# Patient Record
Sex: Female | Born: 1985 | Race: White | Hispanic: No | Smoking: Former smoker
Health system: Southern US, Community
[De-identification: ages and names within clinical notes are randomized; demographics above are authoritative.]

## PROBLEM LIST (undated history)

## (undated) DIAGNOSIS — F909 Attention-deficit hyperactivity disorder, unspecified type: Secondary | ICD-10-CM

## (undated) DIAGNOSIS — B009 Herpesviral infection, unspecified: Secondary | ICD-10-CM

## (undated) DIAGNOSIS — N189 Chronic kidney disease, unspecified: Secondary | ICD-10-CM

## (undated) HISTORY — DX: Chronic kidney disease, unspecified: N18.9

## (undated) HISTORY — PX: KIDNEY SURGERY: SHX687

## (undated) HISTORY — DX: Herpesviral infection, unspecified: B00.9

## (undated) HISTORY — DX: Attention-deficit hyperactivity disorder, unspecified type: F90.9

---

## 2008-06-30 ENCOUNTER — Emergency Department: Payer: Self-pay | Admitting: Emergency Medicine

## 2009-03-18 ENCOUNTER — Inpatient Hospital Stay (HOSPITAL_COMMUNITY): Admission: AD | Admit: 2009-03-18 | Discharge: 2009-03-19 | Payer: Self-pay | Admitting: Obstetrics & Gynecology

## 2010-04-21 ENCOUNTER — Ambulatory Visit: Payer: Self-pay | Admitting: Obstetrics and Gynecology

## 2010-04-21 ENCOUNTER — Inpatient Hospital Stay (HOSPITAL_COMMUNITY): Admission: AD | Admit: 2010-04-21 | Discharge: 2010-04-21 | Payer: Self-pay | Admitting: Obstetrics & Gynecology

## 2010-04-28 ENCOUNTER — Inpatient Hospital Stay (HOSPITAL_COMMUNITY): Admission: RE | Admit: 2010-04-28 | Discharge: 2010-04-28 | Payer: Self-pay | Admitting: Obstetrics & Gynecology

## 2010-04-28 ENCOUNTER — Ambulatory Visit: Payer: Self-pay | Admitting: Obstetrics and Gynecology

## 2010-05-06 ENCOUNTER — Ambulatory Visit: Payer: Self-pay | Admitting: Family Medicine

## 2010-05-06 ENCOUNTER — Encounter: Payer: Self-pay | Admitting: Obstetrics & Gynecology

## 2010-05-06 LAB — CONVERTED CEMR LAB
Antibody Screen: NEGATIVE
Basophils Relative: 0 % (ref 0–1)
Hemoglobin: 13.3 g/dL (ref 12.0–15.0)
MCHC: 33.2 g/dL (ref 30.0–36.0)
Monocytes Absolute: 0.5 10*3/uL (ref 0.1–1.0)
Monocytes Relative: 7 % (ref 3–12)
Neutro Abs: 5.2 10*3/uL (ref 1.7–7.7)
RBC: 4.39 M/uL (ref 3.87–5.11)
Rh Type: POSITIVE
Rubella: 8.6 intl units/mL — ABNORMAL HIGH

## 2010-05-11 ENCOUNTER — Ambulatory Visit: Payer: Self-pay | Admitting: Obstetrics and Gynecology

## 2010-05-11 ENCOUNTER — Other Ambulatory Visit: Admission: RE | Admit: 2010-05-11 | Discharge: 2010-05-11 | Payer: Self-pay | Admitting: Obstetrics and Gynecology

## 2010-06-02 ENCOUNTER — Ambulatory Visit (HOSPITAL_COMMUNITY): Admission: RE | Admit: 2010-06-02 | Discharge: 2010-06-02 | Payer: Self-pay | Admitting: Obstetrics and Gynecology

## 2010-06-09 ENCOUNTER — Ambulatory Visit: Payer: Self-pay | Admitting: Family Medicine

## 2010-06-30 ENCOUNTER — Ambulatory Visit (HOSPITAL_COMMUNITY): Admission: RE | Admit: 2010-06-30 | Discharge: 2010-06-30 | Payer: Self-pay | Admitting: Obstetrics and Gynecology

## 2010-07-07 ENCOUNTER — Ambulatory Visit: Payer: Self-pay | Admitting: Family Medicine

## 2010-07-14 ENCOUNTER — Ambulatory Visit (HOSPITAL_COMMUNITY): Admission: RE | Admit: 2010-07-14 | Discharge: 2010-07-14 | Payer: Self-pay | Admitting: Family Medicine

## 2010-07-14 ENCOUNTER — Encounter: Payer: Self-pay | Admitting: Family Medicine

## 2010-08-04 ENCOUNTER — Ambulatory Visit: Payer: Self-pay | Admitting: Family Medicine

## 2010-09-01 ENCOUNTER — Ambulatory Visit: Payer: Self-pay | Admitting: Family Medicine

## 2010-09-15 ENCOUNTER — Ambulatory Visit: Payer: Self-pay | Admitting: Family Medicine

## 2010-09-15 ENCOUNTER — Encounter (INDEPENDENT_AMBULATORY_CARE_PROVIDER_SITE_OTHER): Payer: Self-pay | Admitting: *Deleted

## 2010-09-15 LAB — CONVERTED CEMR LAB
HIV: NONREACTIVE
Hemoglobin: 11.9 g/dL — ABNORMAL LOW (ref 12.0–15.0)
Platelets: 285 10*3/uL (ref 150–400)
RDW: 12.6 % (ref 11.5–15.5)

## 2010-09-18 ENCOUNTER — Inpatient Hospital Stay (HOSPITAL_COMMUNITY)
Admission: AD | Admit: 2010-09-18 | Discharge: 2010-09-18 | Payer: Self-pay | Source: Home / Self Care | Attending: Family Medicine | Admitting: Family Medicine

## 2010-09-22 ENCOUNTER — Ambulatory Visit (HOSPITAL_COMMUNITY)
Admission: RE | Admit: 2010-09-22 | Discharge: 2010-09-22 | Payer: Self-pay | Source: Home / Self Care | Attending: Obstetrics & Gynecology | Admitting: Obstetrics & Gynecology

## 2010-10-06 ENCOUNTER — Ambulatory Visit
Admission: RE | Admit: 2010-10-06 | Discharge: 2010-10-06 | Payer: Self-pay | Source: Home / Self Care | Attending: Family Medicine | Admitting: Family Medicine

## 2010-10-07 ENCOUNTER — Encounter: Payer: Self-pay | Admitting: Family Medicine

## 2010-10-07 LAB — CONVERTED CEMR LAB: Yeast Wet Prep HPF POC: NONE SEEN

## 2010-10-20 ENCOUNTER — Ambulatory Visit
Admission: RE | Admit: 2010-10-20 | Discharge: 2010-10-20 | Payer: Self-pay | Source: Home / Self Care | Attending: Obstetrics & Gynecology | Admitting: Obstetrics & Gynecology

## 2010-11-03 ENCOUNTER — Encounter: Payer: Medicaid Other | Admitting: Obstetrics and Gynecology

## 2010-11-03 DIAGNOSIS — Z3400 Encounter for supervision of normal first pregnancy, unspecified trimester: Secondary | ICD-10-CM

## 2010-11-17 ENCOUNTER — Encounter: Payer: Self-pay | Admitting: Obstetrics & Gynecology

## 2010-11-17 ENCOUNTER — Encounter: Payer: Medicaid Other | Admitting: Obstetrics and Gynecology

## 2010-11-17 DIAGNOSIS — Z3400 Encounter for supervision of normal first pregnancy, unspecified trimester: Secondary | ICD-10-CM

## 2010-11-24 ENCOUNTER — Encounter: Payer: Medicaid Other | Admitting: Obstetrics and Gynecology

## 2010-11-24 DIAGNOSIS — Z3400 Encounter for supervision of normal first pregnancy, unspecified trimester: Secondary | ICD-10-CM

## 2010-11-30 LAB — URINALYSIS, ROUTINE W REFLEX MICROSCOPIC
Bilirubin Urine: NEGATIVE
Glucose, UA: NEGATIVE mg/dL
Hgb urine dipstick: NEGATIVE
Ketones, ur: NEGATIVE mg/dL
Nitrite: NEGATIVE
Protein, ur: NEGATIVE mg/dL
Specific Gravity, Urine: >1.03 — ABNORMAL HIGH (ref 1.005–1.030)
Urobilinogen, UA: 0.2 mg/dL (ref 0.0–1.0)
pH: 5.5 (ref 5.0–8.0)

## 2010-11-30 LAB — WET PREP, GENITAL: Trich, Wet Prep: NONE SEEN

## 2010-11-30 LAB — GC/CHLAMYDIA PROBE AMP, GENITAL
Chlamydia, DNA Probe: NEGATIVE
GC Probe Amp, Genital: NEGATIVE

## 2010-12-01 ENCOUNTER — Encounter: Payer: Medicaid Other | Admitting: Obstetrics and Gynecology

## 2010-12-01 DIAGNOSIS — Z3400 Encounter for supervision of normal first pregnancy, unspecified trimester: Secondary | ICD-10-CM

## 2010-12-04 LAB — GC/CHLAMYDIA PROBE AMP, GENITAL: Chlamydia, DNA Probe: NEGATIVE

## 2010-12-04 LAB — CBC
Hemoglobin: 14 g/dL (ref 12.0–15.0)
MCH: 31.8 pg (ref 26.0–34.0)
MCHC: 34.3 g/dL (ref 30.0–36.0)
MCV: 92.5 fL (ref 78.0–100.0)
Platelets: 256 10*3/uL (ref 150–400)

## 2010-12-04 LAB — POCT PREGNANCY, URINE: Preg Test, Ur: POSITIVE

## 2010-12-04 LAB — URINALYSIS, ROUTINE W REFLEX MICROSCOPIC
Bilirubin Urine: NEGATIVE
Glucose, UA: NEGATIVE mg/dL
Specific Gravity, Urine: >1.03 — ABNORMAL HIGH (ref 1.005–1.030)
Urobilinogen, UA: 0.2 mg/dL (ref 0.0–1.0)

## 2010-12-04 LAB — URINE MICROSCOPIC-ADD ON

## 2010-12-04 LAB — WET PREP, GENITAL: Clue Cells Wet Prep HPF POC: NONE SEEN

## 2010-12-04 LAB — HCG, QUANTITATIVE, PREGNANCY: hCG, Beta Chain, Quant, S: 21193 m[IU]/mL — ABNORMAL HIGH (ref <5)

## 2010-12-09 ENCOUNTER — Encounter: Payer: Medicaid Other | Admitting: Obstetrics & Gynecology

## 2010-12-09 DIAGNOSIS — Z3400 Encounter for supervision of normal first pregnancy, unspecified trimester: Secondary | ICD-10-CM

## 2010-12-16 ENCOUNTER — Encounter: Payer: Medicaid Other | Admitting: Obstetrics & Gynecology

## 2010-12-16 ENCOUNTER — Inpatient Hospital Stay (HOSPITAL_COMMUNITY)
Admission: AD | Admit: 2010-12-16 | Discharge: 2010-12-19 | DRG: 775 | Disposition: A | Discharge status: Home / Self Care | Payer: Medicaid Other | Source: Ambulatory Visit | Attending: Obstetrics and Gynecology | Admitting: Obstetrics and Gynecology

## 2010-12-16 DIAGNOSIS — O480 Post-term pregnancy: Principal | ICD-10-CM | POA: Diagnosis present

## 2010-12-16 DIAGNOSIS — Z3400 Encounter for supervision of normal first pregnancy, unspecified trimester: Secondary | ICD-10-CM

## 2010-12-16 LAB — CBC
HCT: 33.9 % — ABNORMAL LOW (ref 36.0–46.0)
Hemoglobin: 10.9 g/dL — ABNORMAL LOW (ref 12.0–15.0)
MCV: 82.5 fL (ref 78.0–100.0)
RBC: 4.11 MIL/uL (ref 3.87–5.11)
RDW: 13.4 % (ref 11.5–15.5)
WBC: 9 10*3/uL (ref 4.0–10.5)

## 2010-12-17 DIAGNOSIS — O480 Post-term pregnancy: Secondary | ICD-10-CM

## 2010-12-21 ENCOUNTER — Ambulatory Visit: Payer: Medicaid Other | Admitting: Obstetrics and Gynecology

## 2010-12-28 ENCOUNTER — Ambulatory Visit: Payer: Medicaid Other | Admitting: Obstetrics & Gynecology

## 2010-12-28 DIAGNOSIS — Z09 Encounter for follow-up examination after completed treatment for conditions other than malignant neoplasm: Secondary | ICD-10-CM

## 2010-12-28 LAB — HERPES SIMPLEX VIRUS CULTURE

## 2010-12-28 LAB — POCT PREGNANCY, URINE: Preg Test, Ur: NEGATIVE

## 2010-12-28 LAB — URINE MICROSCOPIC-ADD ON

## 2010-12-28 LAB — URINALYSIS, ROUTINE W REFLEX MICROSCOPIC
Bilirubin Urine: NEGATIVE
Glucose, UA: NEGATIVE mg/dL
Ketones, ur: NEGATIVE mg/dL
pH: 5.5 (ref 5.0–8.0)

## 2011-02-02 ENCOUNTER — Ambulatory Visit (INDEPENDENT_AMBULATORY_CARE_PROVIDER_SITE_OTHER): Payer: Medicaid Other | Admitting: Obstetrics & Gynecology

## 2011-02-23 ENCOUNTER — Ambulatory Visit (INDEPENDENT_AMBULATORY_CARE_PROVIDER_SITE_OTHER): Payer: Medicaid Other | Admitting: Family Medicine

## 2011-02-23 DIAGNOSIS — Z30430 Encounter for insertion of intrauterine contraceptive device: Secondary | ICD-10-CM

## 2011-02-23 DIAGNOSIS — Z3202 Encounter for pregnancy test, result negative: Secondary | ICD-10-CM

## 2011-02-24 NOTE — Assessment & Plan Note (Signed)
Michelle Riddle, ARANAS               ACCOUNT NO.:  [id number]  MEDICAL RECORD NO.:  [id number]           PATIENT TYPE:  LOCATION:  [facility]           FACILITY:  PHYSICIAN:  Tinnie Gens, MD        DATE OF BIRTH:  [birth year]  DATE OF SERVICE:  02/23/2011                                 CLINIC NOTE  CHIEF COMPLAINT:  IUD insertion.  HISTORY OF PRESENT ILLNESS:  The patient is a 25 year old gravida 1, para 1 who is 6 weeks postpartum from a vaginal delivery.  She was seen by Dr. Macon Large on May 15.  She had intercourse and had LMP of May 8. She was scheduled to return after protected intercourse in 2 weeks for repeat pregnancy test which is negative.  She is scheduled for IUD insertion.  PROCEDURE:  The patient was placed in dorsal lithotomy.  As speculum was placed in the vagina.  Cervix visualized, grasped anteriorly with single- tooth tenaculum, cleaned with Betadine x2, sounded to 8 cm and the Mirena IUD was inserted.  The speculum was passed several times easily, but for some reason Mirena would not go until I have regressed the anterior lip of the cervix with a single tooth and had a more direct angle and finally IUD was inserted without difficulty.  Strings were trimmed to 2.5 cm.  IMPRESSION:  Undesired fertility status post Mirena intrauterine device insertion.  PLAN:  The patient will return in 4-6 weeks for IUD string check.  We discussed string length and issues related to that as well as bleeding related to IUD and possible treatments for that.          ______________________________ Tinnie Gens, MD    TP/MEDQ  D:  02/23/2011  T:  02/24/2011  Job:  [id number]

## 2011-03-16 ENCOUNTER — Ambulatory Visit: Payer: Medicaid Other | Admitting: Family Medicine

## 2011-05-17 ENCOUNTER — Ambulatory Visit (INDEPENDENT_AMBULATORY_CARE_PROVIDER_SITE_OTHER): Payer: Medicaid Other | Admitting: Obstetrics and Gynecology

## 2011-05-17 ENCOUNTER — Encounter: Payer: Self-pay | Admitting: Obstetrics and Gynecology

## 2011-05-17 VITALS — BP 125/75 | HR 69 | Ht 64.0 in | Wt 187.0 lb

## 2011-05-17 DIAGNOSIS — IMO0002 Reserved for concepts with insufficient information to code with codable children: Secondary | ICD-10-CM

## 2011-05-17 NOTE — Progress Notes (Signed)
25 yo G1P1 presenting today for evaluation of dyspareunia. Patient states that the onset of pain occurred approximately 2 months ago. She describes the pain as a burning sensation localized in the inferior aspect of the introitus and generalized burning within her vagina. She says that the pain is worst during intercourse and lingers for the next 2-days. Patient has been using different types of scented soaps. Patient denies the presence of a discharge.   Physical exam:  GENERAL: Well-developed, well-nourished female in no acute distress.  ABDOMEN: Soft, nontender, nondistended. No organomegaly. PELVIC: Normal external female genitalia. Introitus erythematous particularly on the inferior aspect; area tender to touch. Perineal laceration healed completely.  Vagina is pink and rugated.  Thin watery discharge. Normal appearing cervix with IUD strings visualized at os. Uterus is normal in size. No adnexal mass or tenderness.  A/P 24yo G1P1 with dyspareunia - Wet prep collected - patient advised to change body soap to Dove (unscented) - pelvic ultrasound ordered to r/o any other etiology - patient advised to abstain until vaginal irritation subsides with the use of the new soap - patient to rtc in 4-5 weeks for follow-up and annual exam

## 2011-05-17 NOTE — Patient Instructions (Signed)
Dyspareunia, Painful Intercourse Dyspareunia is pain caused during sexual intercourse. It is most common in women, but it also happens in men. It is important to tell your caregiver when your pain started, the location of the pain, the type of pain, and when during intercourse it occurs. This helps diagnose and treat the problem. FEMALE CAUSES  The pain from this condition is usually felt when anything is put into the vagina. Even sitting or wearing pants can cause pain. Any part of the genitals can cause pain during sex. Some causes of pain during intercourse are:  Infections of the skin around the vagina.   Vaginal infections, yeast, bacteria, or virus infection.   Vaginismus is a spasm of the muscles around the vagina. The spasm narrows and tightens the vagina and causes pain. The pain of the spasms can be so severe that intercourse is impossible.   Allergic reaction from sperimicides, condoms, scented tampons, soaps, douches, and vaginal sprays.   Cyst (closed sac) on the Bartholin or skene glands, located at the opening of the vagina.   Scar tissue in the vagina, from an episiotomy (surgically enlarged opening), or tearing after delivering a baby.   Vaginal dryness. This is more common in menopause. The normal secretions of the vagina are decreased. Changes in estrogen levels and increased difficulty becoming aroused can cause painful sex.   Lack of foreplay, to lubricate the vagina, can cause vaginal dryness.   Noncancerous tumors (fibroids) in the uterus.   Endometriosis (uterus lining tissue growing outside the uterus).   Tubal pregnancy (pregnancy that starts in the fallopian tube).   Pregnancy or breastfeeding your baby can cause vaginal dryness.   A tilting or prolapse of the uterus. Prolapse is when weak and stretched muscles around the uterus allow it to fall into the vagina.   Problems with the ovaries, cyst, or scar tissue. This may be worse with certain sexual  positions.   Previous surgeries causing adhesions or scar tissue, in the vagina or pelvis.   Bladder and intestinal problems.   Psychological problems may cause pain.   Negative attitudes about sex, experiencing rape, sexual assault, and misinformation about sex are often related to some types of pain.   Previous pelvic infection, causing scar tissue in the pelvis and on the female organs.   Cyst or tumor on the ovary.   Cancer of the female organs.   Certain medicines may be responsible.   Medical problems, diabetes, arthritis, thyroid disease.   Sometimes a cause cannot be found.  FEMALE CAUSES In men, there are many physical causes of sexual discomfort. Some causes of pain during intercourse are:  Infections of the prostate, bladder, or seminal vesicles can cause pain after ejaculation.   Men suffering from interstitial cystitis (inflamed bladder) may have pain from ejaculation.   Gonorrheal infections may cause pain during ejaculation.   Urethritis (inflamed urethra) or prostatitis (inflamed prostate) can make genital stimulation painful or uncomfortable.   Deformities of the penis, such as Peyronie's disease, may cause pain during intercourse.   A tight foreskin may be a cause of pain.   Cancer of the female organs.   Psychological problems are also a cause of pain.  DIAGNOSIS  Your caregiver will take a history and have you describe where the pain is located (outside the vagina, in the vagina, in the pelvis).   Following this, your caregiver will do a physical exam.   For females, let your caregiver know if the exam is too  painful.   During the final part of the exam, your doctor will feel your uterus and ovaries with one hand on the abdomen and one finger in your vagina. This is a pelvic exam.   Blood tests, Pap test, cultures for infection, ultrasound test, and X-rays may be done. You may need to see a specialist for female problems (gynecologist).   Your  caregiver may do a laparoscopy (looking into the pelvis with a lighted tube, through an incision in the abdomen), CT Scan, or MRI.  TREATMENT Your caregiver can help you determine the best course of treatment. Sometimes more testing is done. Continue with the suggested testing, until your caregiver feels sure about your diagnosis and how to treat it. Continue with a caregiver until you are satisfied that the reason for the pain has been found. Be sure that you know what to do about it. Sometimes it is difficult to find the reason for the pain. The search for the cause can be frustrating.  The treatment depends on the cause of the pain. Treatment may include:  Medicines such as antibiotics, vaginal or skin creams, hormones, or antidepressants.   You may need minor or major surgery.   Psychological counseling or group therapy may be needed.   Kegel exercises and vaginal dilators, to help certain cases of vaginismus (spasms).   Apply lubrication as recommended by your caregiver, if you have dryness.   Seek counseling, if treatment does not help.   Sex therapy for you and your sex partner may be necessary.  It is common for the pain to continue after the reason for the pain has been treated. Some reasons for this include a conditioned response. This means that the person having the pain becomes so familiar with the pain, that the pain continues as a response, even though the cause is removed. HOME CARE INSTRUCTIONS  Follow your caregiver's instructions about taking medicines, doing tests, counseling, and follow-up treatment.   Do not use scented tampons, douches, vaginal sprays, or soaps.   Use water- based lubricants for dryness. Oil lubricants can cause irritation.   Do not use spermicides or condoms that irritate you.   Openly discuss with your partner your sexual experience, your desires, foreplay, and different sexual positions for a more comfortable and enjoyable sexual relationship.     Join group sessions for therapy, if needed.   Practice safe sex at all times.   Discuss the problem with your sex partner.   Empty your bladder before having intercourse.   Try different positions during sexual intercourse.   Take over-the-counter pain medicine recommended by your caregiver, before having sexual intercourse.  SEEK MEDICAL CARE IF:  You develop vaginal bleeding after sexual intercourse.   You develop a lump at the opening of your vagina, even if it is not painful.   You have abnormal vaginal discharge.   You have vaginal dryness.   You have itching or irritation of the vulva and/or vagina.   You develop a rash or reaction to your medicine.  SEEK IMMEDIATE MEDICAL CARE IF:  You develop severe abdominal pain during or shortly after sexual intercourse. (You could have a ruptured ovarian cyst or ruptured tubal pregnancy.)   You develop a temperature of 100F (37.9C) or higher, after having sexual intercourse.   You have painful or bloody urination.   You have painful sexual intercourse, and you never had it before.   You pass out after having sexual intercourse.  Document Released: 09/26/2007 Document Re-Released:  12/01/2009 Exit[employer] Patient Information 2011 [place].

## 2011-05-17 NOTE — Progress Notes (Signed)
Patient has been having vaginal soreness after intercourse.

## 2011-05-18 ENCOUNTER — Other Ambulatory Visit: Payer: Self-pay | Admitting: Obstetrics and Gynecology

## 2011-05-19 LAB — WET PREP, GENITAL: Clue Cells Wet Prep HPF POC: NONE SEEN

## 2011-05-20 ENCOUNTER — Other Ambulatory Visit: Payer: Self-pay | Admitting: Obstetrics and Gynecology

## 2011-05-20 MED ORDER — CLOTRIMAZOLE 1 % VA CREA: TOPICAL_CREAM | VAGINAL | Status: DC

## 2011-06-15 ENCOUNTER — Ambulatory Visit: Payer: Medicaid Other | Admitting: Family Medicine

## 2011-06-15 DIAGNOSIS — Z01419 Encounter for gynecological examination (general) (routine) without abnormal findings: Secondary | ICD-10-CM

## 2012-08-27 IMAGING — US US OB NUCHAL TRANSLUCENCY 1ST GEST
1 series · 18 of 28 positions shown · non-contrast
Comparison: none

OBSTETRICAL ULTRASOUND:
 This ultrasound was performed in The [HOSPITAL], and the [facility] report will be stored to [REDACTED] PACS.  This report is also available in [HOSPITAL]?s accessANYware.

[Series 1: us ob nuchal translucency 1st gest · 18 of 36 slices shown]
[im 1/36]
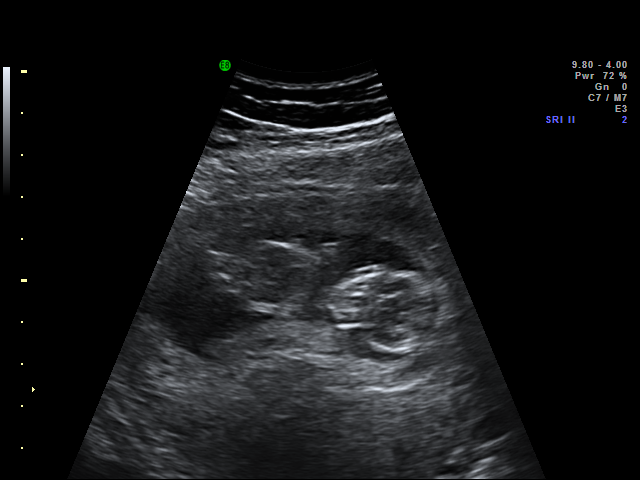
[im 3/36]
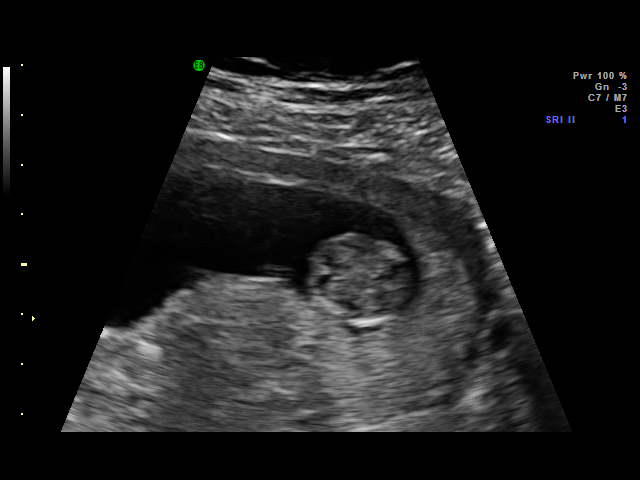
[im 4/36]
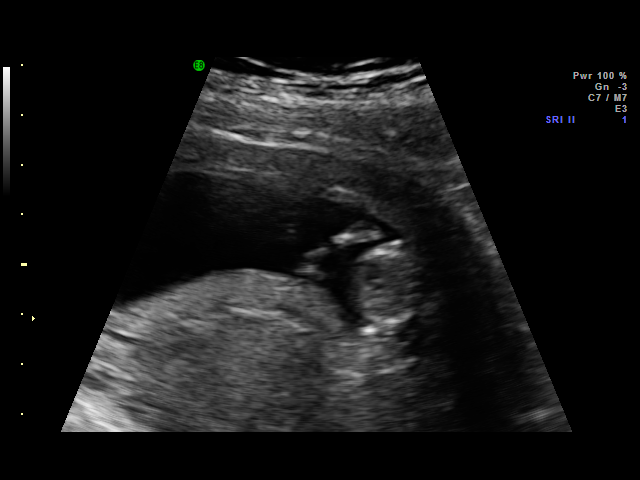
[im 7/36]
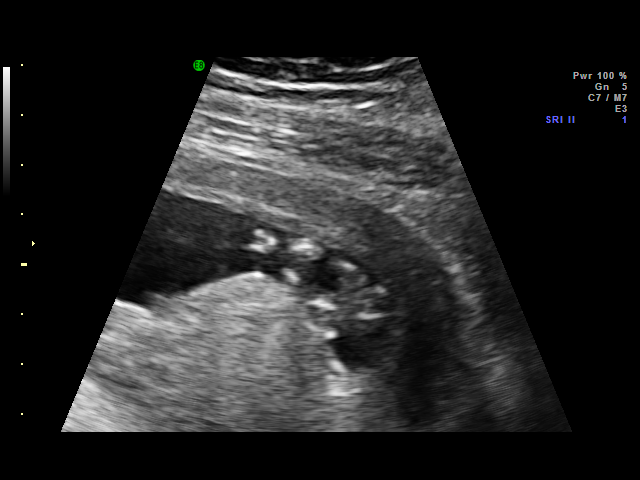
[im 10/36]
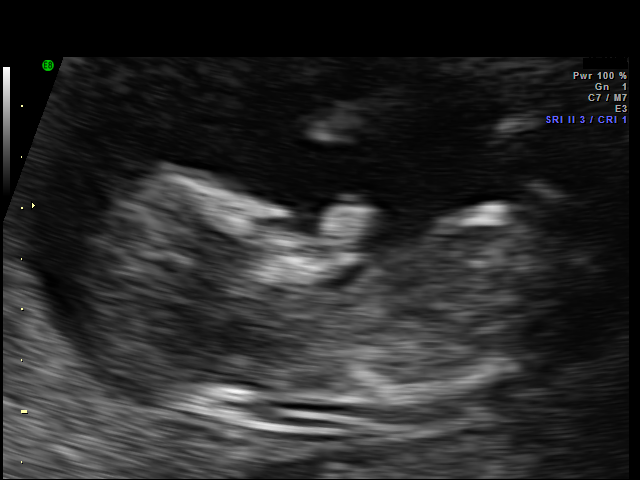
[im 11/36]
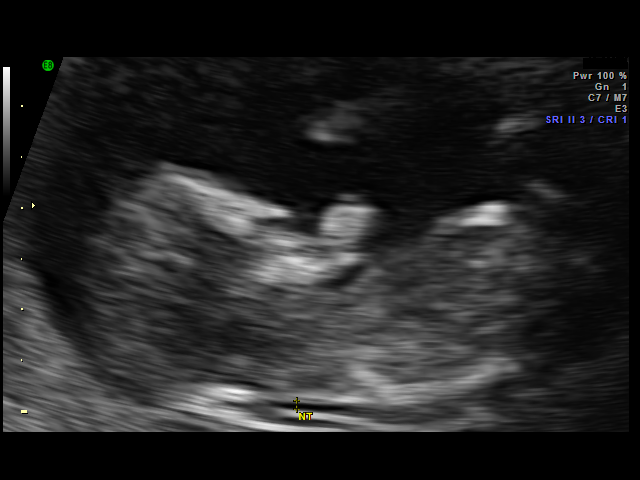
[im 13/36]
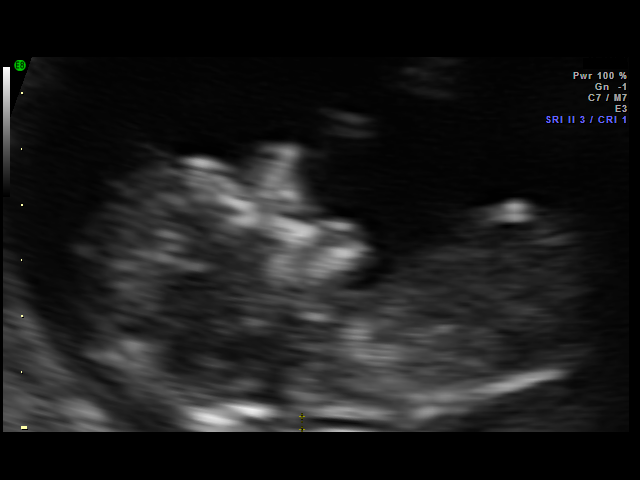
[im 15/36]
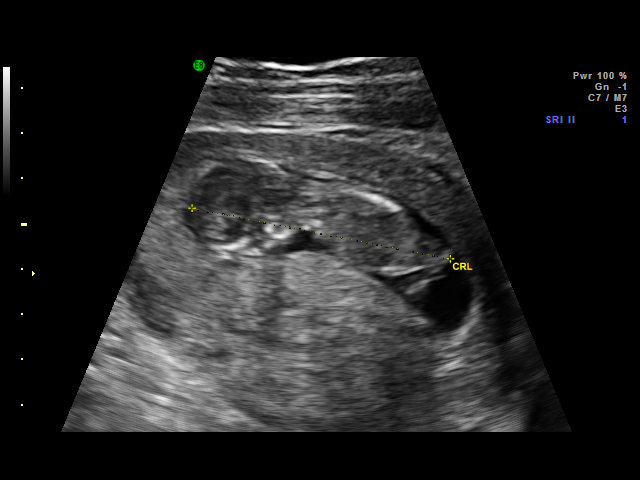
[im 17/36]
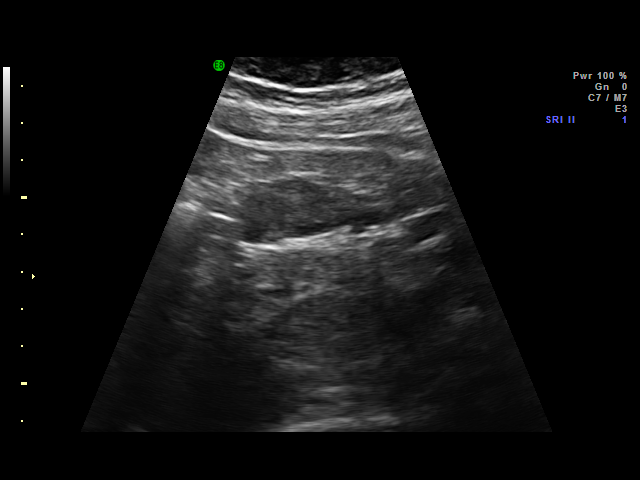
[im 19/36]
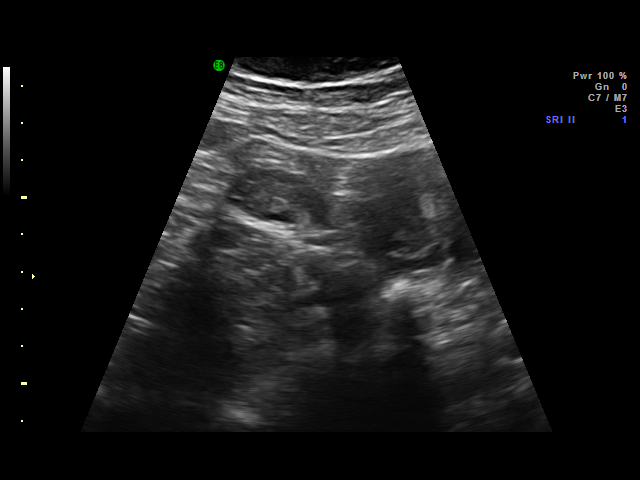
[im 21/36]
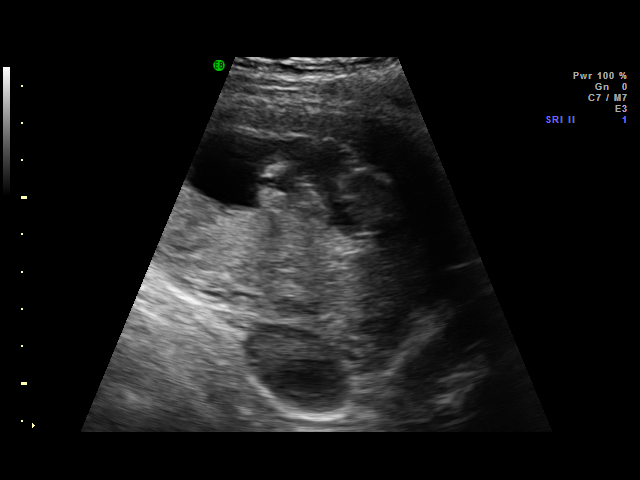
[im 23/36]
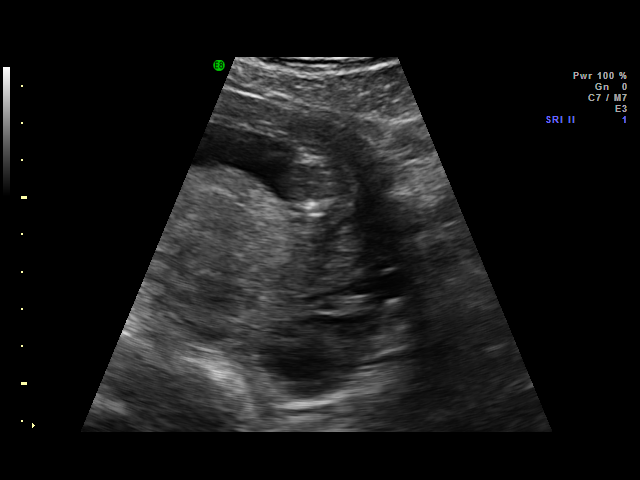
[im 25/36]
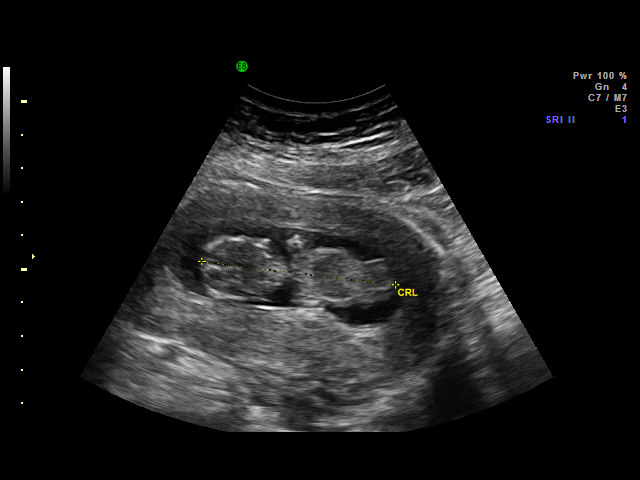
[im 28/36]
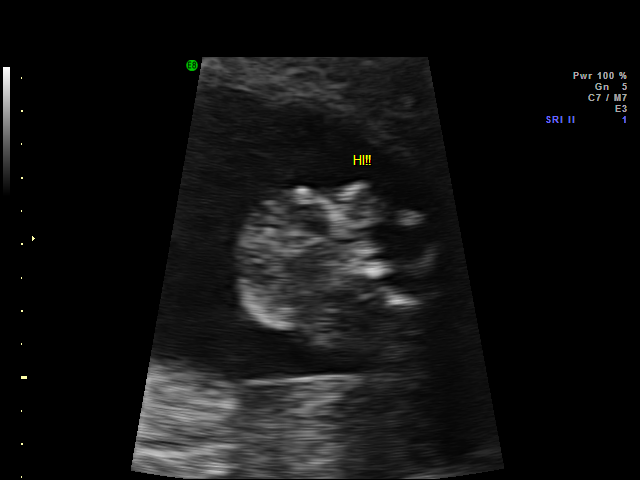
[im 29/36]
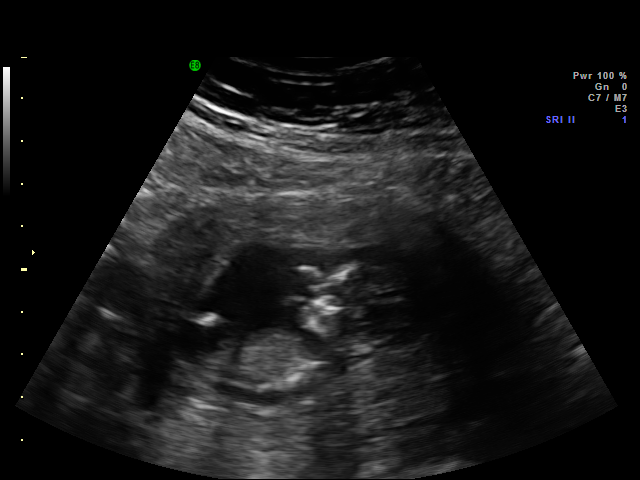
[im 32/36]
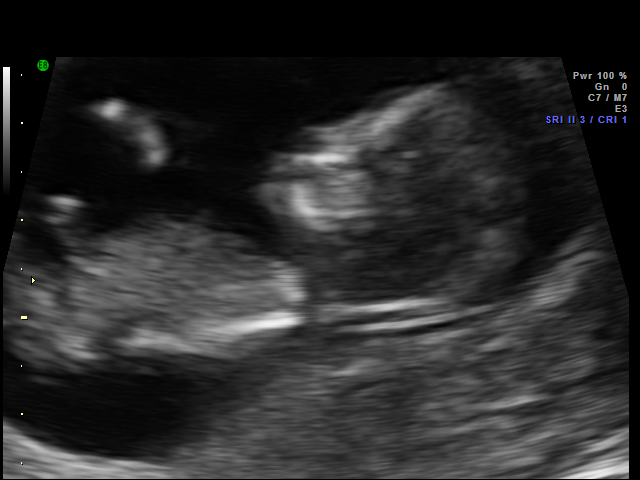
[im 33/36]
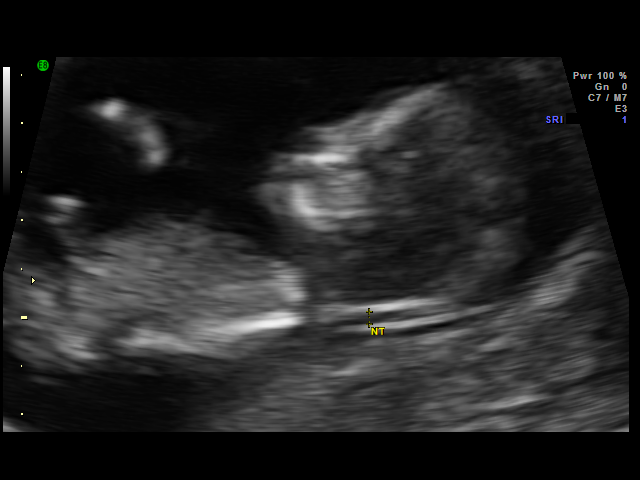
[im 36/36]
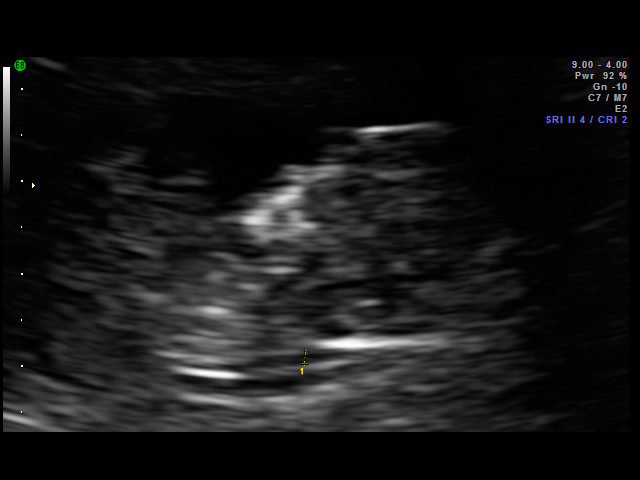

[18 of 28 positions shown; findings below may reference images not displayed]

IMPRESSION: [facility] has also been faxed to the ordering physician.

## 2014-12-27 ENCOUNTER — Emergency Department (HOSPITAL_BASED_OUTPATIENT_CLINIC_OR_DEPARTMENT_OTHER)
Admission: EM | Admit: 2014-12-27 | Discharge: 2014-12-27 | Disposition: A | Discharge status: Home / Self Care | Payer: Medicaid Other | Attending: Emergency Medicine | Admitting: Emergency Medicine

## 2014-12-27 ENCOUNTER — Encounter (HOSPITAL_BASED_OUTPATIENT_CLINIC_OR_DEPARTMENT_OTHER): Payer: Self-pay | Admitting: *Deleted

## 2014-12-27 DIAGNOSIS — Z79899 Other long term (current) drug therapy: Secondary | ICD-10-CM | POA: Insufficient documentation

## 2014-12-27 DIAGNOSIS — Z8619 Personal history of other infectious and parasitic diseases: Secondary | ICD-10-CM | POA: Insufficient documentation

## 2014-12-27 DIAGNOSIS — M545 Low back pain, unspecified: Secondary | ICD-10-CM

## 2014-12-27 DIAGNOSIS — Z720 Tobacco use: Secondary | ICD-10-CM | POA: Insufficient documentation

## 2014-12-27 DIAGNOSIS — Z3202 Encounter for pregnancy test, result negative: Secondary | ICD-10-CM | POA: Insufficient documentation

## 2014-12-27 LAB — URINE MICROSCOPIC-ADD ON

## 2014-12-27 LAB — URINALYSIS, ROUTINE W REFLEX MICROSCOPIC
Bilirubin Urine: NEGATIVE
Glucose, UA: NEGATIVE mg/dL
Hgb urine dipstick: NEGATIVE
Ketones, ur: NEGATIVE mg/dL
NITRITE: NEGATIVE
PH: 6 (ref 5.0–8.0)
Protein, ur: NEGATIVE mg/dL
SPECIFIC GRAVITY, URINE: 1.019 (ref 1.005–1.030)
UROBILINOGEN UA: 0.2 mg/dL (ref 0.0–1.0)

## 2014-12-27 LAB — PREGNANCY, URINE: PREG TEST UR: NEGATIVE

## 2014-12-27 MED ORDER — KETOROLAC TROMETHAMINE 60 MG/2ML IM SOLN
60.0000 mg | Freq: Once | INTRAMUSCULAR | Status: AC
  Administered 2014-12-27: 60 mg via INTRAMUSCULAR
  Filled 2014-12-27: qty 2

## 2014-12-27 MED ORDER — IBUPROFEN 600 MG PO TABS: 600.0000 mg | ORAL_TABLET | Freq: Four times a day (QID) | ORAL | Status: DC | PRN

## 2014-12-27 MED ORDER — HYDROCODONE-ACETAMINOPHEN 5-325 MG PO TABS: 1.0000 | ORAL_TABLET | Freq: Four times a day (QID) | ORAL | Status: DC | PRN

## 2014-12-27 NOTE — Discharge Instructions (Signed)
Back Pain, Adult Low back pain is very common. About 1 in 5 people have back pain.The cause of low back pain is rarely dangerous. The pain often gets better over time.About half of people with a sudden onset of back pain feel better in just 2 weeks. About 8 in 10 people feel better by 6 weeks.  CAUSES Some common causes of back pain include:  Strain of the muscles or ligaments supporting the spine.  Wear and tear (degeneration) of the spinal discs.  Arthritis.  Direct injury to the back. DIAGNOSIS Most of the time, the direct cause of low back pain is not known.However, back pain can be treated effectively even when the exact cause of the pain is unknown.Answering your caregiver's questions about your overall health and symptoms is one of the most accurate ways to make sure the cause of your pain is not dangerous. If your caregiver needs more information, he or she may order lab work or imaging tests (X-rays or MRIs).However, even if imaging tests show changes in your back, this usually does not require surgery. HOME CARE INSTRUCTIONS For many people, back pain returns.Since low back pain is rarely dangerous, it is often a condition that people can learn to manageon their own.   Remain active. It is stressful on the back to sit or stand in one place. Do not sit, drive, or stand in one place for more than 30 minutes at a time. Take short walks on level surfaces as soon as pain allows.Try to increase the length of time you walk each day.  Do not stay in bed.Resting more than 1 or 2 days can delay your recovery.  Do not avoid exercise or work.Your body is made to move.It is not dangerous to be active, even though your back may hurt.Your back will likely heal faster if you return to being active before your pain is gone.  Pay attention to your body when you bend and lift. Many people have less discomfortwhen lifting if they bend their knees, keep the load close to their bodies,and  avoid twisting. Often, the most comfortable positions are those that put less stress on your recovering back.  Find a comfortable position to sleep. Use a firm mattress and lie on your side with your knees slightly bent. If you lie on your back, put a pillow under your knees.  Only take over-the-counter or prescription medicines as directed by your caregiver. Over-the-counter medicines to reduce pain and inflammation are often the most helpful.Your caregiver may prescribe muscle relaxant drugs.These medicines help dull your pain so you can more quickly return to your normal activities and healthy exercise.  Put ice on the injured area.  Put ice in a plastic bag.  Place a towel between your skin and the bag.  Leave the ice on for 15-20 minutes, 03-04 times a day for the first 2 to 3 days. After that, ice and heat may be alternated to reduce pain and spasms.  Ask your caregiver about trying back exercises and gentle massage. This may be of some benefit.  Avoid feeling anxious or stressed.Stress increases muscle tension and can worsen back pain.It is important to recognize when you are anxious or stressed and learn ways to manage it.Exercise is a great option. SEEK MEDICAL CARE IF:  You have pain that is not relieved with rest or medicine.  You have pain that does not improve in 1 week.  You have new symptoms.  You are generally not feeling well. SEEK   IMMEDIATE MEDICAL CARE IF:   You have pain that radiates from your back into your legs.  You develop new bowel or bladder control problems.  You have unusual weakness or numbness in your arms or legs.  You develop nausea or vomiting.  You develop abdominal pain.  You feel faint. Document Released: 09/06/2005 Document Revised: 03/07/2012 Document Reviewed: 01/08/2014 ExitCare Patient Information 2015 [employer]. This information is not intended to replace advice given to you by your health care provider. Make sure you  discuss any questions you have with your health care provider.  

## 2014-12-27 NOTE — ED Notes (Signed)
MD at bedside. 

## 2014-12-27 NOTE — ED Notes (Signed)
Pt c/o bilat lower back pain with left > right since yesterday. Pt denies dysuria.

## 2014-12-27 NOTE — ED Provider Notes (Signed)
CSN: [id number]     Arrival date & time 12/27/14  1308 History   First MD Initiated Contact with Patient 12/27/14 1354     Chief Complaint  Patient presents with  . Back Pain     (Consider location/radiation/quality/duration/timing/severity/associated sxs/prior Treatment) HPI  This a 29 year old female who presents with low back pain. Onset of symptoms yesterday. Denies any heavy lifting urinary symptoms. Reports the pain is achy. It is across her lower back. Current pain is 3 out of 10. She took ibuprofen last night with minimal relief. She woke up with more pain this morning. Denies any radiation of the pain. Denies any weakness, numbness, tingling of the lower extremities, bowel or bladder issues. Reports that she occasionally has low back pain that is exacerbated every couple of months.  Past Medical History  Diagnosis Date  . HSV (herpes simplex virus) infection    History reviewed. No pertinent past surgical history. Family History  Problem Relation Age of Onset  . Diabetes Maternal Grandfather     TYPE 2   History  Substance Use Topics  . Smoking status: Current Every Day Smoker -- 0.50 packs/day for 3 years    Types: Cigarettes  . Smokeless tobacco: Never Used     Comment: weaning off/tn  . Alcohol Use: Yes     Comment: rarely maybe once a month   OB History    Gravida Para Term Preterm AB TAB SAB Ectopic Multiple Living   2 2 1       1      Review of Systems  Constitutional: Negative for fever.  Gastrointestinal: Negative for vomiting and diarrhea.  Genitourinary: Negative for flank pain and difficulty urinating.  Musculoskeletal: Positive for back pain.  Neurological: Negative for weakness and numbness.  All other systems reviewed and are negative.     Allergies  Review of patient's allergies indicates no known allergies.  Home Medications   Prior to Admission medications   Medication Sig Start Date End Date Taking? Authorizing Provider  clotrimazole  (GYNE-LOTRIMIN) 1 % vaginal cream Apply to affected area twice daily 05/20/11   Catalina AntiguaPeggy Constant, MD  HYDROcodone-acetaminophen (NORCO/VICODIN) 5-325 MG per tablet Take 1 tablet by mouth every 6 (six) hours as needed. 12/27/14   Shon Batonourtney F Nyaire Denbleyker, MD  ibuprofen (ADVIL,MOTRIN) 600 MG tablet Take 1 tablet (600 mg total) by mouth every 6 (six) hours as needed. 12/27/14   Shon Batonourtney F Eriyonna Matsushita, MD  levonorgestrel (MIRENA) 20 MCG/24HR IUD 1 each by Intrauterine route once.   02/23/11   Historical Provider, MD  Prenatal MV-Min-Fe Fum-FA-DHA (PRENATAL 1 PO) Take by mouth.      Historical Provider, MD   BP 120/82 mmHg  Pulse 74  Temp(Src) 98.2 F (36.8 C) (Oral)  Resp 18  Ht 5\' 3"  (1.6 m)  Wt 175 lb (79.379 kg)  BMI 31.01 kg/m2  SpO2 100% Physical Exam  Constitutional: She is oriented to person, place, and time. She appears well-developed and well-nourished. No distress.  HENT:  Head: Normocephalic and atraumatic.  Cardiovascular: Normal rate, regular rhythm and normal heart sounds.   No murmur heard. Pulmonary/Chest: Effort normal and breath sounds normal. No respiratory distress. She has no wheezes.  Musculoskeletal:  Tenderness palpation of the bilateral paraspinous muscle regions of the lumbar spine, no midline tenderness  Neurological: She is alert and oriented to person, place, and time.  Normal bilateral lower extremity reflexes, normal strength, normal gait  Skin: Skin is warm and dry.  Psychiatric: She has a normal  mood and affect.  Nursing note and vitals reviewed.   ED Course  Procedures (including critical care time) Labs Review Labs Reviewed  URINALYSIS, ROUTINE W REFLEX MICROSCOPIC - Abnormal; Notable for the following:    APPearance CLOUDY (*)    Leukocytes, UA SMALL (*)    All other components within normal limits  PREGNANCY, URINE  URINE MICROSCOPIC-ADD ON    Imaging Review No results found.   EKG Interpretation None      MDM   Final diagnoses:  Lumbosacral pain    patient presents with lower back pain. Reports frequent exacerbations of lower back pain. Denies any urinary symptoms. Neurovascularly intact. Low suspicion for cauda equina. Patient given IM Toradol. We'll symptomatically treat with ibuprofen and short course of pain medication at home. Discussed with patient strict return precautions.  After history, exam, and medical workup I feel the patient has been appropriately medically screened and is safe for discharge home. Pertinent diagnoses were discussed with the patient. Patient was given return precautions.   Shon Baton, MD 12/27/14 1420

## 2016-02-19 ENCOUNTER — Ambulatory Visit: Payer: Medicaid Other | Admitting: Obstetrics & Gynecology

## 2016-03-19 ENCOUNTER — Emergency Department
Admission: EM | Admit: 2016-03-19 | Discharge: 2016-03-19 | Disposition: A | Discharge status: Home / Self Care | Payer: No Typology Code available for payment source | Attending: Emergency Medicine | Admitting: Emergency Medicine

## 2016-03-19 ENCOUNTER — Encounter: Payer: Self-pay | Admitting: Emergency Medicine

## 2016-03-19 DIAGNOSIS — F17210 Nicotine dependence, cigarettes, uncomplicated: Secondary | ICD-10-CM | POA: Insufficient documentation

## 2016-03-19 DIAGNOSIS — M62838 Other muscle spasm: Secondary | ICD-10-CM | POA: Diagnosis not present

## 2016-03-19 DIAGNOSIS — Y999 Unspecified external cause status: Secondary | ICD-10-CM | POA: Insufficient documentation

## 2016-03-19 DIAGNOSIS — Z79899 Other long term (current) drug therapy: Secondary | ICD-10-CM | POA: Insufficient documentation

## 2016-03-19 DIAGNOSIS — M549 Dorsalgia, unspecified: Secondary | ICD-10-CM | POA: Diagnosis present

## 2016-03-19 DIAGNOSIS — Y9389 Activity, other specified: Secondary | ICD-10-CM | POA: Insufficient documentation

## 2016-03-19 DIAGNOSIS — Y92410 Unspecified street and highway as the place of occurrence of the external cause: Secondary | ICD-10-CM | POA: Diagnosis not present

## 2016-03-19 DIAGNOSIS — M62830 Muscle spasm of back: Secondary | ICD-10-CM | POA: Diagnosis not present

## 2016-03-19 MED ORDER — MELOXICAM 15 MG PO TABS: 15.0000 mg | ORAL_TABLET | Freq: Every day | ORAL | Status: DC

## 2016-03-19 MED ORDER — METHOCARBAMOL 500 MG PO TABS: 500.0000 mg | ORAL_TABLET | Freq: Four times a day (QID) | ORAL | Status: DC
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
## 2017-01-27 ENCOUNTER — Encounter: Payer: Self-pay | Admitting: Family Medicine

## 2017-01-27 ENCOUNTER — Ambulatory Visit (INDEPENDENT_AMBULATORY_CARE_PROVIDER_SITE_OTHER): Payer: Medicaid Other | Admitting: Family Medicine

## 2017-01-27 VITALS — BP 110/81 | HR 85 | Ht 64.0 in | Wt 179.0 lb

## 2017-01-27 DIAGNOSIS — N979 Female infertility, unspecified: Secondary | ICD-10-CM

## 2017-01-27 DIAGNOSIS — Z01411 Encounter for gynecological examination (general) (routine) with abnormal findings: Secondary | ICD-10-CM

## 2017-01-27 DIAGNOSIS — Z01419 Encounter for gynecological examination (general) (routine) without abnormal findings: Secondary | ICD-10-CM

## 2017-01-27 DIAGNOSIS — Z124 Encounter for screening for malignant neoplasm of cervix: Secondary | ICD-10-CM

## 2017-01-27 DIAGNOSIS — N911 Secondary amenorrhea: Secondary | ICD-10-CM

## 2017-01-27 DIAGNOSIS — Z1151 Encounter for screening for human papillomavirus (HPV): Secondary | ICD-10-CM

## 2017-01-27 NOTE — Patient Instructions (Signed)
Infertility Infertility is when you are unable to get pregnant (conceive) after a year of having sex regularly without using birth control. Infertility can also mean that a woman is not able to carry a pregnancy to full term. Both women and men can have fertility problems. What causes infertility? What Causes Infertility in Women?  There are many possible causes of infertility in women. For some women, the cause of infertility is not known (unexplained infertility). Infertility can also be linked to more than one cause. Infertility problems in women can be caused by problems with the menstrual cycle or reproductive organs, certain medical conditions, and factors related to lifestyle and age.  Problems with your menstrual cycle can interfere with your ovaries producing eggs (ovulation). This can make it difficult to get pregnant. This includes having a menstrual cycle that is very long, very short, or irregular.  Problems with reproductive organs can include:  An abnormally narrow cervix or a cervix that does not remain closed during a pregnancy.  A blockage in your fallopian tubes.  An abnormally shaped uterus.  Uterine fibroids. This is a tissue mass (tumor) that can develop on your uterus.  Medical conditions that can affect a woman's fertility include:  Polycystic ovarian syndrome (PCOS). This is a hormonal disorder that can cause small cysts to grow on your ovaries. This is the most common cause of infertility in women.  Endometriosis. This is a condition in which the tissue that lines your uterus (endometrium) grows outside of its normal location.  Primary ovary insufficiency. This is when your ovaries stop producing eggs and hormones before the age of 43.  Sexually transmitted diseases, such as chlamydia or gonorrhea. These infections can cause scarring in your fallopian tubes. This makes it difficult for eggs to reach your uterus.  Autoimmune disorders. These are disorders in  which your immune system attacks normal, healthy cells.  Hormone imbalances.  Other factors include:  Age. A woman's fertility declines with age, especially after her mid-26s.  Being under- or overweight.  Drinking too much alcohol.  Using drugs.  Exercising excessively.  Being exposed to environmental toxins, such as radiation, pesticides, and certain chemicals. What Causes Infertility in Men?  There are many causes of infertility in men. Infertility can be linked to more than one cause. Infertility problems in men can be caused by problems with sperm or the reproductive organs, certain medical conditions, and factors related to lifestyle and age. Some men have unexplained infertility.  Problems with sperm. Infertility can result if there is a problem producing:  Enough sperm (low sperm count).  Enough normally-shaped sperm (sperm morphology).  Sperm that are able to reach the egg (poor motility).  Infertility can also be caused by:  A problem with hormones.  Enlarged veins (varicoceles), cysts (spermatoceles), or tumors of the testicles.  Sexual dysfunction.  Injury to the testicles.  A birth defect, such as not having the tubes that carry sperm (vas deferens).  Medical conditions that can affect a man's fertility include:  Diabetes.  Cancer treatments, such as chemotherapy or radiation.  Klinefelter syndrome. This is an inherited genetic disorder.  Thyroid problems, such as an under- or overactive thyroid.  Cystic fibrosis.  Sexually transmitted diseases.  Other factors include:  Age. A man's fertility declines with age.  Drinking too much alcohol.  Using drugs.  Being exposed to environmental toxins, such as pesticides and lead. What are the symptoms of infertility? Being unable to get pregnant after one year of having regular  sex without using birth control is the only sign of infertility. How is infertility diagnosed? In order to be diagnosed  with infertility, both partners will have a physical exam. Both partners will also have an extensive medical and sexual history taken. If there is no obvious reason for infertility, additional tests may be done. What Tests Will Women Have?  Women may first have tests to check whether they are ovulating each month. The tests may include:  Blood tests to check hormone levels.  An ultrasound of the ovaries. This looks for possible problems on or in the ovaries.  Taking a small sample of the tissue that lines the uterus for examination under a microscope (endometrial biopsy). Women who are ovulating may have additional tests. These may include:  Hysterosalpingography.  This is an X-ray of the fallopian tubes and uterus taken after a specific type of dye is injected.  This test can show the shape of the uterus and whether the fallopian tubes are open.  Laparoscopy.  In this test, a lighted tube (laparoscope) is used to look for problems in the fallopian tubes and other female organs.  Transvaginal ultrasound.  This is an imaging test to check for abnormalities of the uterus and ovaries.  A health care provider can use this test to count the number of follicles on the ovaries.  Hysteroscopy.  This test involves using a lighted tube to examine the cervix and inside the uterus.  It is done to find any abnormalities inside the uterus. What Tests Will Men Have?  Tests for men's infertility includes:  Semen tests to check sperm count, morphology, and motility.  Blood tests to check for hormone levels.  Taking a small sample of tissue from inside a testicle (biopsy). This is examined under a microscope.  Blood tests to check for genetic abnormalities (genetic testing). How are women treated for infertility? Treatment depends on the cause of infertility. Most cases of infertility in women are treated with medicine or surgery.  Women may take medicine to:  Correct ovulation  problems.  Treat other health conditions, such as PCOS.  Surgery may be done to:  Repair damage to the ovaries, fallopian tubes, cervix, or uterus.  Remove growths from the uterus.  Remove scar tissue from the uterus, pelvis, or other female organs. How are men treated for infertility? Treatment depends on the cause of infertility. Most cases of infertility in men are treated with medicine or surgery.  Men may take medicine to:  Correct hormone problems.  Treat other health conditions.  Treat sexual dysfunction.  Surgery may be done to:  Remove blockages in the reproductive tract.  Correct other structural problems of the reproductive tract. What is assisted reproductive technology? Assisted reproductive technology (ART) refers to all treatments and procedures that combine eggs and sperm outside the body to try to help a couple conceive. ART is often combined with fertility drugs to stimulate ovulation. Sometimes ART is done using eggs retrieved from another woman's body (donor eggs) or from previously frozen fertilized eggs (embryos). There are different types of ART. These include:  Intrauterine insemination (IUI).  In this procedure, sperm is placed directly into a woman's uterus with a long, thin tube.  This may be most effective for infertility caused by sperm problems, including low sperm count and low motility.  Can be used in combination with fertility drugs.  In vitro fertilization (IVF).  This is often done when a woman's fallopian tubes are blocked or when a man has  low sperm counts.  Fertility drugs stimulate the ovaries to produce multiple eggs. Once mature, these eggs are removed from the body and combined with the sperm to be fertilized.  These fertilized eggs are then placed in the woman's uterus. This information is not intended to replace advice given to you by your health care provider. Make sure you discuss any questions you have with your health care  provider. Document Released: 09/09/2003 Document Revised: 02/06/2016 Document Reviewed: 05/22/2014 Elsevier Interactive Patient Education  2017 [place] 18-39 Years, Female Preventive care refers to lifestyle choices and visits with your health care provider that can promote health and wellness. What does preventive care include?  A yearly physical exam. This is also called an annual well check.  Dental exams once or twice a year.  Routine eye exams. Ask your health care provider how often you should have your eyes checked.  Personal lifestyle choices, including:  Daily care of your teeth and gums.  Regular physical activity.  Eating a healthy diet.  Avoiding tobacco and drug use.  Limiting alcohol use.  Practicing safe sex.  Taking vitamin and mineral supplements as recommended by your health care provider. What happens during an annual well check? The services and screenings done by your health care provider during your annual well check will depend on your age, overall health, lifestyle risk factors, and family history of disease. Counseling  Your health care provider may ask you questions about your:  Alcohol use.  Tobacco use.  Drug use.  Emotional well-being.  Home and relationship well-being.  Sexual activity.  Eating habits.  Work and work Statistician.  Method of birth control.  Menstrual cycle.  Pregnancy history. Screening  You may have the following tests or measurements:  Height, weight, and BMI.  Diabetes screening. This is done by checking your blood sugar (glucose) after you have not eaten for a while (fasting).  Blood pressure.  Lipid and cholesterol levels. These may be checked every 5 years starting at age 45.  Skin check.  Hepatitis C blood test.  Hepatitis B blood test.  Sexually transmitted disease (STD) testing.  BRCA-related cancer screening. This may be done if you have a family history of breast,  ovarian, tubal, or peritoneal cancers.  Pelvic exam and Pap test. This may be done every 3 years starting at age 23. Starting at age 81, this may be done every 5 years if you have a Pap test in combination with an HPV test. Discuss your test results, treatment options, and if necessary, the need for more tests with your health care provider. Vaccines  Your health care provider may recommend certain vaccines, such as:  Influenza vaccine. This is recommended every year.  Tetanus, diphtheria, and acellular pertussis (Tdap, Td) vaccine. You may need a Td booster every 10 years.  Varicella vaccine. You may need this if you have not been vaccinated.  HPV vaccine. If you are 26 or younger, you may need three doses over 6 months.  Measles, mumps, and rubella (MMR) vaccine. You may need at least one dose of MMR. You may also need a second dose.  Pneumococcal 13-valent conjugate (PCV13) vaccine. You may need this if you have certain conditions and were not previously vaccinated.  Pneumococcal polysaccharide (PPSV23) vaccine. You may need one or two doses if you smoke cigarettes or if you have certain conditions.  Meningococcal vaccine. One dose is recommended if you are age 21-21 years and a Market researcher living in  a residence hall, or if you have one of several medical conditions. You may also need additional booster doses.  Hepatitis A vaccine. You may need this if you have certain conditions or if you travel or work in places where you may be exposed to hepatitis A.  Hepatitis B vaccine. You may need this if you have certain conditions or if you travel or work in places where you may be exposed to hepatitis B.  Haemophilus influenzae type b (Hib) vaccine. You may need this if you have certain risk factors. Talk to your health care provider about which screenings and vaccines you need and how often you need them. This information is not intended to replace advice given to you by  your health care provider. Make sure you discuss any questions you have with your health care provider. Document Released: 11/02/2001 Document Revised: 05/26/2016 Document Reviewed: 07/08/2015 Elsevier Interactive Patient Education  2017 [employer].

## 2017-01-27 NOTE — Progress Notes (Signed)
Pt here for annual GYN appt. She c/o amenorrhea X2 months. She removed her IUD 03/2016 and has tracked her cycles since then. Cycles are typically 33 days and she is on day 68 now and is trying to conceive. She states she took her IUD out due to painful intercourse but that did not resolve. Pain location is vaginal and lower abdominal

## 2017-01-27 NOTE — Progress Notes (Signed)
Subjective:     Michelle Riddle is a 31 y.o. female and is here for a comprehensive physical exam. The patient reports problems - trying to get pregnant and painful intercourse.6 years since birth of last child. Had Mirena placed and wanted to get pregnant, so pulled her IUD. Was having some painful intercourse which she thought was related to her IUD, but this did not improve. Has had painful intercourse since the birth of her daughter, but not all the time. Trying to conceive since July of last year, with same partner of her daughter, cycles had been coming q 31-34 days. Currently 68 days since last cycle. Has no history PID, no abdominal surgeries. No painful menses.   Social History   Social History  . Marital status: Single    Spouse name: N/A  . Number of children: N/A  . Years of education: N/A   Occupational History  . Not on file.   Social History Main Topics  . Smoking status: Current Every Day Smoker    Packs/day: 0.50    Years: 3.00    Types: Cigarettes  . Smokeless tobacco: Never Used     Comment: weaning off/tn  . Alcohol use Yes     Comment: rarely maybe once a month  . Drug use: No  . Sexual activity: Yes    Partners: Male    Birth control/ protection: None   Other Topics Concern  . Not on file   Social History Narrative  . No narrative on file   Health Maintenance  Topic Date Due  . TETANUS/TDAP  08/18/2005  . PAP SMEAR  08/19/2007  . INFLUENZA VACCINE  04/20/2017  . HIV Screening  Completed    The following portions of the patient's history were reviewed and updated as appropriate: allergies, current medications, past family history, past medical history, past social history, past surgical history and problem list.  Review of Systems Pertinent items noted in HPI and remainder of comprehensive ROS otherwise negative.   Objective:    BP 110/81   Pulse 85   Ht 5\' 4"  (1.626 m)   Wt 179 lb (81.2 kg)   LMP 11/21/2016   Breastfeeding? No   BMI 30.73  kg/m  General appearance: alert, cooperative and appears stated age Head: Normocephalic, without obvious abnormality, atraumatic Neck: no adenopathy, supple, symmetrical, trachea midline and thyroid not enlarged, symmetric, no tenderness/mass/nodules Lungs: clear to auscultation bilaterally Breasts: normal appearance, no masses or tenderness Heart: regular rate and rhythm, S1, S2 normal, no murmur, click, rub or gallop Abdomen: soft, non-tender; bowel sounds normal; no masses,  no organomegaly Pelvic: cervix normal in appearance, external genitalia normal, no adnexal masses or tenderness, no cervical motion tenderness, uterus normal size, shape, and consistency, vagina normal without discharge and no pelvic tenderness Extremities: extremities normal, atraumatic, no cyanosis or edema Pulses: 2+ and symmetric Skin: Skin color, texture, turgor normal. No rashes or lesions Lymph nodes: Cervical, supraclavicular, and axillary nodes normal. Neurologic: Grossly normal    Assessment:    Healthy female exam.      Plan:   Problem List Items Addressed This Visit      Unprioritized   Infertility, female    Discussed options of work-up once she has been trying to conceive for 1 year. Have husband switch back to less fitted underwear.      Secondary amenorrhea    Check TSH and PRL and FSH--if no cycle at 90 days and negative UPT consider provera challenge.  Relevant Orders   TSH   Follicle stimulating hormone   Prolactin    Other Visit Diagnoses    Screening for malignant neoplasm of cervix    -  Primary   Relevant Orders   Cytology - PAP   Encounter for gynecological examination with abnormal finding         Return in 3 months (on 04/29/2017).    See After Visit Summary for Counseling Recommendations

## 2017-01-27 NOTE — Assessment & Plan Note (Addendum)
Check TSH and PRL and FSH--if no cycle at 90 days and negative UPT consider provera challenge.

## 2017-01-27 NOTE — Assessment & Plan Note (Signed)
Discussed options of work-up once she has been trying to conceive for 1 year. Have husband switch back to less fitted underwear.

## 2017-01-28 ENCOUNTER — Encounter: Payer: Self-pay | Admitting: Family Medicine

## 2017-01-28 LAB — CYTOLOGY - PAP
Diagnosis: NEGATIVE
HPV: NOT DETECTED

## 2017-01-28 LAB — PROLACTIN: PROLACTIN: 8.1 ng/mL (ref 4.8–23.3)

## 2017-01-28 LAB — TSH: TSH: 4.81 u[IU]/mL — AB (ref 0.450–4.500)

## 2017-01-28 LAB — FOLLICLE STIMULATING HORMONE: FSH: 5.2 m[IU]/mL

## 2017-02-02 LAB — T4, FREE: Free T4: 1.08 ng/dL (ref 0.82–1.77)

## 2017-02-02 LAB — SPECIMEN STATUS REPORT

## 2017-02-02 LAB — T3, FREE: T3, Free: 2.9 pg/mL (ref 2.0–4.4)

## 2017-04-20 ENCOUNTER — Encounter: Payer: Self-pay | Admitting: Student

## 2017-04-20 ENCOUNTER — Ambulatory Visit (INDEPENDENT_AMBULATORY_CARE_PROVIDER_SITE_OTHER): Payer: Medicaid Other | Admitting: Student

## 2017-04-20 DIAGNOSIS — Z3481 Encounter for supervision of other normal pregnancy, first trimester: Secondary | ICD-10-CM

## 2017-04-20 DIAGNOSIS — Z3480 Encounter for supervision of other normal pregnancy, unspecified trimester: Secondary | ICD-10-CM

## 2017-04-20 DIAGNOSIS — Z3689 Encounter for other specified antenatal screening: Secondary | ICD-10-CM

## 2017-04-20 NOTE — Progress Notes (Signed)
PRENATAL INTAKE SUMMARY  Ms. Michelle Riddle presents today New OB visit. She has no unusual complaints and complains of nausea with vomiting for approximately 10 days.

## 2017-04-20 NOTE — Patient Instructions (Signed)

## 2017-04-20 NOTE — Progress Notes (Signed)
DATING AND VIABILITY SONOGRAM   Michelle RosebushKrystal Riddle is a 31 y.o. year old G2P1001 with LMP Patient's last menstrual period was 01/28/2017. which would correlate to  2523w0d weeks gestation.  She has irregular menstrual cycles.   She is here today for a confirmatory initial sonogram.    GESTATION: SINGLETON     FETAL ACTIVITY:          Heart rate: 137bpm          The fetus is current.  GESTATIONAL AGE AND  BIOMETRICS:  Gestational criteria: Estimated Date of Delivery: 12/07/17 by early ultrasound now at 2723w0d  Previous Scans:0  CROWN RUMP LENGTH           0.9CM 4723w0d                                        AVERAGE EGA(BY THIS SCAN):  7.0 weeks  WORKING EDD( early ultrasound ):  12/07/17      TECHNICIAN COMMENTS:  SLIUP measuring 3323w0d which is 4w different from LMP. FHR 137bpm.   A copy of this report including all images has been saved and backed up to a second source for retrieval if needed. All measures and details of the anatomical scan, placentation, fluid volume and pelvic anatomy are contained in that report.  Mandy Hutchinson 04/20/2017 3:12 PM

## 2017-04-21 ENCOUNTER — Encounter: Payer: Self-pay | Admitting: Student

## 2017-04-21 NOTE — Progress Notes (Signed)
  Subjective:    Michelle Riddle is being seen today for her first obstetrical visit.  This is not a planned pregnancy. She is at 126w1d gestation. 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 Relationship with FOB: spouse, living together. Patient does intend to breast feed. Pregnancy history fully reviewed.  Patient reports nausea with occasional vomiting at the end of the day.   Review of Systems:   Review of Systems  Respiratory: Negative.   Cardiovascular: Negative.   Gastrointestinal: Positive for nausea and vomiting.  Genitourinary: Negative.   Psychiatric/Behavioral: Negative.     Objective:     BP 108/70   Pulse 82   Wt 179 lb (81.2 kg)   LMP 01/28/2017   BMI 30.73 kg/m  Physical Exam  Constitutional: She is oriented to person, place, and time. She appears well-developed.  HENT:  Head: Normocephalic.  Cardiovascular: Normal rate.   Respiratory: Effort normal.  GI: Soft.  Genitourinary: Vagina normal.  Musculoskeletal: Normal range of motion.  Neurological: She is alert and oriented to person, place, and time.  Skin: Skin is warm and dry.  Psychiatric: She has a normal mood and affect.    Exam    Assessment:    Pregnancy: G2P1001 Patient Active Problem List   Diagnosis Date Noted  . Supervision of other normal pregnancy, antepartum 04/20/2017  . Infertility, female 01/27/2017  . Secondary amenorrhea 01/27/2017       Plan:     Initial labs will be drawn at next visit, when Medicaid is completed.  Prenatal vitamins. Problem list reviewed and updated. AFP3 discussed: planning. Role of ultrasound in pregnancy discussed; fetal survey: will order at next visit. Amniocentesis discussed: not indicated. Follow up in 4 weeks. 50% of 30 min visit spent on counseling and coordination of care.  Patient given samples of Bonjesta, as well as information on OTC B6 and unisom.   Reviewed warning signs and when to visit MAU; all questions answered;     Charlesetta GaribaldiKathryn Lorraine [facility] CNM 04/24/2017

## 2017-04-22 ENCOUNTER — Encounter: Payer: Self-pay | Admitting: *Deleted

## 2017-05-18 ENCOUNTER — Encounter: Payer: Self-pay | Admitting: Student

## 2017-05-18 ENCOUNTER — Ambulatory Visit (INDEPENDENT_AMBULATORY_CARE_PROVIDER_SITE_OTHER): Payer: Medicaid Other | Admitting: Student

## 2017-05-18 DIAGNOSIS — Z3482 Encounter for supervision of other normal pregnancy, second trimester: Secondary | ICD-10-CM

## 2017-05-18 DIAGNOSIS — O210 Mild hyperemesis gravidarum: Secondary | ICD-10-CM

## 2017-05-18 DIAGNOSIS — Z3480 Encounter for supervision of other normal pregnancy, unspecified trimester: Secondary | ICD-10-CM

## 2017-05-18 MED ORDER — DOXYLAMINE-PYRIDOXINE ER 20-20 MG PO TBCR: 20.0000 mg | EXTENDED_RELEASE_TABLET | Freq: Two times a day (BID) | ORAL | 1 refills | Status: DC

## 2017-05-18 NOTE — Patient Instructions (Signed)
Eating Plan for Hyperemesis Gravidarum °Hyperemesis gravidarum is a severe form of morning sickness. Because this condition causes severe nausea and vomiting, it can lead to dehydration, malnutrition, and weight loss. One way to lessen the symptoms of nausea and vomiting is to follow the eating plan for hyperemesis gravidarum. It is often used along with prescribed medicines to control your symptoms. °What can I do to relieve my symptoms? °Listen to your body. Everyone is different and has different preferences. Find what works best for you. Take any of the following actions that are helpful to you: °· Eat and drink slowly. °· Eat 5-6 small meals daily instead of 3 large meals. °· Eat crackers before you get out of bed in the morning. °· Try having a snack in the middle of the night. °· Starchy foods are usually tolerated well. Examples include cereal, toast, bread, potatoes, pasta, rice, and pretzels. °· Ginger may help with nausea. Add ¼ tsp ground ginger to hot tea or choose ginger tea. °· Try drinking 100% fruit juice or an electrolyte drink. An electrolyte drink contains sodium, potassium, and chloride. °· Continue to take your prenatal vitamins as told by your health care provider. If you are having trouble taking your prenatal vitamins, talk with your health care provider about different options. °· Include at least 1 serving of protein with your meals and snacks. Protein options include meats or poultry, beans, nuts, eggs, and yogurt. Try eating a protein-rich snack before bed. Examples of these snacks include cheese and crackers or half of a peanut butter or turkey sandwich. °· Consider eliminating foods that trigger your symptoms. These may include spicy foods, coffee, high-fat foods, very sweet foods, and acidic foods. °· Try meals that have more protein combined with bland, salty, lower-fat, and dry foods, such as nuts, seeds, pretzels, crackers, and cereal. °· Talk with your healthcare provider about  starting a supplement of vitamin B6. °· Have fluids that are cold, clear, and carbonated or sour. Examples include lemonade, ginger ale, lemon-lime soda, ice water, and sparkling water. °· Try lemon or mint tea. °· Try brushing your teeth or using a mouth rinse after meals. ° °What should I avoid to reduce my symptoms? °Avoiding some of the following things may help reduce your symptoms. °· Foods with strong smells. Try eating meals in well-ventilated areas that are free of odors. °· Drinking water or other beverages with meals. Try not to drink anything during the 30 minutes before and after your meals. °· Drinking more than 1 cup of fluid at a time. Sometimes using a straw helps. °· Fried or high-fat foods, such as butter and cream sauces. °· Spicy foods. °· Skipping meals as best as you can. Nausea can be more intense on an empty stomach. If you cannot tolerate food at that time, do not force it. Try sucking on ice chips or other frozen items, and make up for missed calories later. °· Lying down within 2 hours after eating. °· Environmental triggers. These may include smoky rooms, closed spaces, rooms with strong smells, warm or humid places, overly loud and noisy rooms, and rooms with motion or flickering lights. °· Quick and sudden changes in your movement. ° °This information is not intended to replace advice given to you by your health care provider. Make sure you discuss any questions you have with your health care provider. °Document Released: 07/04/2007 Document Revised: 05/05/2016 Document Reviewed: 04/06/2016 °Elsevier Interactive Patient Education © 2018 [employer] ° °

## 2017-05-19 ENCOUNTER — Encounter: Payer: Self-pay | Admitting: Student

## 2017-05-19 NOTE — Progress Notes (Signed)
   PRENATAL VISIT NOTE  Subjective:  Michelle Riddle is a 31 y.o. G2P1001 at 2058w1d being seen today for ongoing prenatal care.  She is currently monitored for the following issues for this low-risk pregnancy and has Infertility, female; Secondary amenorrhea; Supervision of other normal pregnancy, antepartum; and Morning sickness on her problem list.  Patient reports no complaints.  Contractions: Not present. Vag. Bleeding: None.  Movement: Absent. Denies leaking of fluid.   The following portions of the patient's history were reviewed and updated as appropriate: allergies, current medications, past family history, past medical history, past social history, past surgical history and problem list. Problem list updated.  Objective:   Vitals:   05/18/17 1440  BP: 123/83  Pulse: 76  Weight: 177 lb (80.3 kg)    Fetal Status: Fetal Heart Rate (bpm): 152/US   Movement: Absent     General:  Alert, oriented and cooperative. Patient is in no acute distress.  Skin: Skin is warm and dry. No rash noted.   Cardiovascular: Normal heart rate noted  Respiratory: Normal respiratory effort, no problems with respiration noted  Abdomen: Soft, gravid, appropriate for gestational age.  Pain/Pressure: Absent     Pelvic: Cervical exam deferred        Extremities: Normal range of motion.  Edema: None  Mental Status:  Normal mood and affect. Normal behavior. Normal judgment and thought content.   Assessment and Plan:  Pregnancy: G2P1001 at 258w1d  1. Supervision of other normal pregnancy, antepartum  - US MFM OB COMP + 14 WK; Future - Culture, OB Urine; Future - Obstetric Panel, Including HIV - HgB A1c - Genetic Screening - Glucose, random - Cystic Fibrosis Mutation 97 - SMN1 Copy Number Analysis - Hemoglobinopathy evaluation  2. Morning sickness 1. Patient has stopped taking her Bonjesta a few days ago, and her morning sickness has returned. RX for [employer] given; patient to continue to take daily for  now.  Preterm labor symptoms and general obstetric precautions including but not limited to vaginal bleeding, contractions, leaking of fluid and fetal movement were reviewed in detail with the patient. Please refer to After Visit Summary for other counseling recommendations.  Return in about 8 weeks (around 07/13/2017).   Marylene LandKathryn Lorraine Mame Twombly, CNM

## 2017-05-24 ENCOUNTER — Other Ambulatory Visit: Payer: Self-pay | Admitting: *Deleted

## 2017-05-27 LAB — CULTURE, OB URINE

## 2017-05-27 LAB — URINE CULTURE, OB REFLEX

## 2017-05-28 LAB — OBSTETRIC PANEL, INCLUDING HIV
ANTIBODY SCREEN: NEGATIVE
BASOS: 0 %
Basophils Absolute: 0 10*3/uL (ref 0.0–0.2)
EOS (ABSOLUTE): 0 10*3/uL (ref 0.0–0.4)
EOS: 1 %
HEMATOCRIT: 39.1 % (ref 34.0–46.6)
HEP B S AG: NEGATIVE
HIV SCREEN 4TH GENERATION: NONREACTIVE
Hemoglobin: 13.2 g/dL (ref 11.1–15.9)
Immature Grans (Abs): 0 10*3/uL (ref 0.0–0.1)
Immature Granulocytes: 0 %
LYMPHS ABS: 2.1 10*3/uL (ref 0.7–3.1)
LYMPHS: 31 %
MCH: 30.2 pg (ref 26.6–33.0)
MCHC: 33.8 g/dL (ref 31.5–35.7)
MCV: 90 fL (ref 79–97)
MONOCYTES: 5 %
Monocytes Absolute: 0.3 10*3/uL (ref 0.1–0.9)
NEUTROS ABS: 4.2 10*3/uL (ref 1.4–7.0)
Neutrophils: 63 %
Platelets: 267 10*3/uL (ref 150–379)
RBC: 4.37 x10E6/uL (ref 3.77–5.28)
RDW: 12.7 % (ref 12.3–15.4)
RH TYPE: POSITIVE
RPR: NONREACTIVE
RUBELLA: 3.76 {index} (ref >0.99)
WBC: 6.7 10*3/uL (ref 3.4–10.8)

## 2017-05-28 LAB — HEMOGLOBINOPATHY EVALUATION
HGB C: 0 %
HGB S: 0 %
HGB VARIANT: 0 %
Hemoglobin A2 Quantitation: 2.3 % (ref 1.8–3.2)
Hemoglobin F Quantitation: 0 % (ref 0.0–2.0)
Hgb A: 97.7 % (ref 96.4–98.8)

## 2017-05-28 LAB — SMN1 COPY NUMBER ANALYSIS (SMA CARRIER SCREENING)

## 2017-05-28 LAB — SPECIMEN STATUS REPORT

## 2017-05-28 LAB — CYSTIC FIBROSIS MUTATION 97

## 2017-05-28 LAB — GLUCOSE, RANDOM: Glucose: 74 mg/dL (ref 65–99)

## 2017-05-28 LAB — HEMOGLOBIN A1C
Est. average glucose Bld gHb Est-mCnc: 100 mg/dL
Hgb A1c MFr Bld: 5.1 % (ref 4.8–5.6)

## 2017-05-30 ENCOUNTER — Other Ambulatory Visit: Payer: Self-pay | Admitting: Student

## 2017-05-30 DIAGNOSIS — O9989 Other specified diseases and conditions complicating pregnancy, childbirth and the puerperium: Principal | ICD-10-CM

## 2017-05-30 DIAGNOSIS — R8271 Bacteriuria: Secondary | ICD-10-CM

## 2017-05-30 MED ORDER — NITROFURANTOIN MONOHYD MACRO 100 MG PO CAPS: 100.0000 mg | ORAL_CAPSULE | Freq: Two times a day (BID) | ORAL | 0 refills | Status: DC

## 2017-05-30 NOTE — Progress Notes (Signed)
Message sent to clinic to call patient and inform her of UTI and for her to pick up RX

## 2017-05-31 ENCOUNTER — Encounter: Payer: Self-pay | Admitting: *Deleted

## 2017-06-06 ENCOUNTER — Ambulatory Visit (INDEPENDENT_AMBULATORY_CARE_PROVIDER_SITE_OTHER): Payer: Medicaid Other | Admitting: Obstetrics and Gynecology

## 2017-06-06 VITALS — BP 121/82 | HR 73 | Wt 178.0 lb

## 2017-06-06 DIAGNOSIS — O26892 Other specified pregnancy related conditions, second trimester: Secondary | ICD-10-CM

## 2017-06-06 DIAGNOSIS — M545 Low back pain, unspecified: Secondary | ICD-10-CM

## 2017-06-06 MED ORDER — CEPHALEXIN 500 MG PO CAPS: 500.0000 mg | ORAL_CAPSULE | Freq: Four times a day (QID) | ORAL | 0 refills | Status: AC

## 2017-06-06 MED ORDER — CYCLOBENZAPRINE HCL 10 MG PO TABS
10.0000 mg | ORAL_TABLET | Freq: Three times a day (TID) | ORAL | 1 refills | Status: DC | PRN
Start: 2017-06-06 — End: 2017-12-15

## 2017-06-06 NOTE — Progress Notes (Signed)
Intermittent abd cramping and constant back pain

## 2017-06-06 NOTE — Progress Notes (Signed)
Prenatal Work In Visit Note Date: 06/06/2017 Clinic: [facility]  CC: low back pain   Subjective:  Michelle Riddle is a 31 y.o. G2P1001 at [redacted]w[redacted]d being seen today for ongoing prenatal care.  She is currently monitored for the following issues for this low-risk pregnancy and has Infertility, female; Secondary amenorrhea; Supervision of other normal pregnancy, antepartum; Morning sickness; and Asymptomatic bacteriuria during pregnancy in first trimester on her problem list.  Patient reports occasional low back pain. No fevers, chills, VB, LOF, nausea, vomiting.   The following portions of the patient's history were reviewed and updated as appropriate: allergies, current medications, past family history, past medical history, past social history, past surgical history and problem list. Problem list updated.  Objective:   Vitals:   06/06/17 1406  BP: 121/82  Pulse: 73  Weight: 178 lb (80.7 kg)    Fetal Status:           General:  Alert, oriented and cooperative. Patient is in no acute distress.  Skin: Skin is warm and dry. No rash noted.   Cardiovascular: Normal heart rate noted  Respiratory: Normal respiratory effort, no problems with respiration noted  Abdomen: Soft, gravid, appropriate for gestational age.       Pelvic:  Cervical exam performed        EGBUS normal Normal vault and cervix, visually closed and cl/long/high   Extremities: Normal range of motion.  Edema: None  Mental Status: Normal mood and affect. Normal behavior. Normal judgment and thought content.  Back: no CVAT  Urinalysis:      Assessment and Plan:  Pregnancy: G2P1001 at [redacted]w[redacted]d  1. Low back pain during pregnancy in second trimester Pt states she didn't know about the abx that was sent in. Will do keflex and ER precautions for pyelo given.   Preterm labor symptoms and general obstetric precautions including but not limited to vaginal bleeding, contractions, leaking of fluid and fetal movement  were reviewed in detail with the patient. Please refer to After Visit Summary for other counseling recommendations.  RTC: PRN   [place] Bing, MD

## 2017-07-04 ENCOUNTER — Encounter (HOSPITAL_COMMUNITY): Payer: Self-pay | Admitting: Student

## 2017-07-12 ENCOUNTER — Ambulatory Visit (HOSPITAL_COMMUNITY)
Admission: RE | Admit: 2017-07-12 | Discharge: 2017-07-12 | Disposition: A | Discharge status: Home / Self Care | Payer: Medicaid Other | Source: Ambulatory Visit | Attending: Student | Admitting: Student

## 2017-07-12 ENCOUNTER — Other Ambulatory Visit: Payer: Self-pay | Admitting: Student

## 2017-07-12 DIAGNOSIS — O99210 Obesity complicating pregnancy, unspecified trimester: Secondary | ICD-10-CM

## 2017-07-12 DIAGNOSIS — Z3A18 18 weeks gestation of pregnancy: Secondary | ICD-10-CM

## 2017-07-12 DIAGNOSIS — E669 Obesity, unspecified: Secondary | ICD-10-CM | POA: Insufficient documentation

## 2017-07-12 DIAGNOSIS — Z3480 Encounter for supervision of other normal pregnancy, unspecified trimester: Secondary | ICD-10-CM

## 2017-07-12 DIAGNOSIS — O322XX0 Maternal care for transverse and oblique lie, not applicable or unspecified: Secondary | ICD-10-CM | POA: Diagnosis not present

## 2017-07-12 DIAGNOSIS — Z363 Encounter for antenatal screening for malformations: Secondary | ICD-10-CM

## 2017-07-12 DIAGNOSIS — O9212 Cracked nipple associated with the puerperium: Secondary | ICD-10-CM | POA: Insufficient documentation

## 2017-07-13 ENCOUNTER — Ambulatory Visit (INDEPENDENT_AMBULATORY_CARE_PROVIDER_SITE_OTHER): Payer: Medicaid Other | Admitting: Student

## 2017-07-13 VITALS — BP 119/78 | HR 102 | Wt 177.2 lb

## 2017-07-13 DIAGNOSIS — O99891 Other specified diseases and conditions complicating pregnancy: Secondary | ICD-10-CM | POA: Insufficient documentation

## 2017-07-13 DIAGNOSIS — L739 Follicular disorder, unspecified: Secondary | ICD-10-CM | POA: Insufficient documentation

## 2017-07-13 DIAGNOSIS — O9989 Other specified diseases and conditions complicating pregnancy, childbirth and the puerperium: Secondary | ICD-10-CM

## 2017-07-13 DIAGNOSIS — R8271 Bacteriuria: Secondary | ICD-10-CM

## 2017-07-13 DIAGNOSIS — Z3480 Encounter for supervision of other normal pregnancy, unspecified trimester: Secondary | ICD-10-CM

## 2017-07-13 DIAGNOSIS — O210 Mild hyperemesis gravidarum: Secondary | ICD-10-CM

## 2017-07-13 MED ORDER — CEPHALEXIN 250 MG/5ML PO SUSR: 500.0000 mg | Freq: Four times a day (QID) | ORAL | 0 refills | Status: DC

## 2017-07-14 MED ORDER — CEPHALEXIN 250 MG/5ML PO SUSR: 500.0000 mg | Freq: Four times a day (QID) | ORAL | 0 refills | Status: AC

## 2017-07-14 NOTE — Addendum Note (Signed)
Addended by: Chrystie NoseKOOISTRA, Cervando Durnin L on: 07/14/2017 03:12 PM   Modules accepted: Orders

## 2017-07-14 NOTE — Progress Notes (Signed)
   PRENATAL VISIT NOTE  Subjective:  Michelle Riddle is a 31 y.o. G2P1001 at 80w1dbeing seen today for ongoing prenatal care.  She is currently monitored for the following issues for this low-risk pregnancy and has Infertility, female; Secondary amenorrhea; Supervision of other normal pregnancy, antepartum; Morning sickness; Asymptomatic bacteriuria during pregnancy in first trimester; and Pruritic folliculitis of pregnancy on her problem list.  Patient reports that she has a rash on her breasts and back. It started a few weeks ago, and it does not itch. It seems to be getting better and fading away. Denies itching on hands and feet or itching at night. No rash on hands or feet. .  Contractions: Not present.  .  Movement: Present. Denies leaking of fluid.   The following portions of the patient's history were reviewed and updated as appropriate: allergies, current medications, past family history, past medical history, past social history, past surgical history and problem list. Problem list updated.  Objective:   Vitals:   07/13/17 1449  BP: 119/78  Pulse: (!) 102  Weight: 177 lb 3.2 oz (80.4 kg)    Fetal Status: Fetal Heart Rate (bpm): 142   Movement: Present     General:  Alert, oriented and cooperative. Patient is in no acute distress.  Skin: Skin is warm and dry. Breasts and back with small red pustules consistent with folliculitis of pregnancy.   Cardiovascular: Normal heart rate noted  Respiratory: Normal respiratory effort, no problems with respiration noted  Abdomen: Soft, gravid, appropriate for gestational age.  Pain/Pressure: Absent     Pelvic: Cervical exam deferred        Extremities: Normal range of motion.  Edema: None  Mental Status:  Normal mood and affect. Normal behavior. Normal judgment and thought content.   Assessment and Plan:  Pregnancy: G2P1001 at 187w1d1. Supervision of other normal pregnancy, antepartum  - [place] OB FOLLOW UP; Future - Bile acids,  total - Comp Met (CMET)  2. Pruritic folliculitis of pregnancy -Given that rash is resolving, recommended comfort measures. Will draw bile acids out of caution. Patient will call [place]oreaack if the rash gets worse of changes.   3. Asymptomatic bacteriuria during pregnancy in first trimester Patient was unable to take her keflex because she gagged on the pill. Re-ordered Keflex in liquid form. Impressed upon patient the importance of taking medication; patient agrees to take the meds for 10 days.   4. Morning sickness Occasional vomiting; not taking Bonjesta.   Preterm labor symptoms and general obstetric precautions including but not limited to vaginal bleeding, contractions, leaking of fluid and fetal movement were reviewed in detail with the patient. Please refer to After Visit Summary for other counseling recommendations.  No Follow-up on file.   [place] LakeCNM

## 2017-07-14 NOTE — Patient Instructions (Signed)
Asymptomatic Bacteriuria Asymptomatic bacteriuria is the presence of a large number of bacteria in the urine without the usual symptoms of burning or frequent urination. What are the causes? This condition is caused by an increase in bacteria in the urine. This increase can be caused by:  Bacteria entering the urinary tract, such as during sex.  A blockage in the urinary tract, such as from kidney stones or a tumor.  Bladder problems that prevent the bladder from emptying.  What increases the risk? You are more likely to develop this condition if:  You have diabetes mellitus.  You are an elderly adult, especially if you are also in a long-term care facility.  You are pregnant and in the first trimester.  You have kidney stones.  You are female.  You have had a kidney transplant.  You have a leaky kidney tube valve (reflux).  You had a urinary catheter for a long period of time.  What are the signs or symptoms? There are no symptoms of this condition. How is this diagnosed? This condition is diagnosed with a urine test. Because this condition does not cause symptoms, it is usually diagnosed when a urine sample is taken to treat or diagnose another condition, such as pregnancy or kidney problems. Most women who are in their first trimester of pregnancy are screened for asymptomatic bacteriuria. How is this treated? Usually, treatment is not needed for this condition. Treating the condition can lead to other problems, such as a yeast infection or the growth of bacteria that do not respond to treatment (antibiotic-resistant bacteria). Some people, such as pregnant women and people with kidney transplants, do need treatment with antibiotic medicines to prevent kidney infection (pyelonephritis). In pregnant women, kidney infection can lead to premature labor, fetal growth restriction, or newborn death. Follow these instructions at home: Medicines  Take over-the-counter and  prescription medicines only as told by your health care provider.  If you were prescribed an antibiotic medicine, take it as told by your health care provider. Do not stop taking the antibiotic even if you start to feel better. General instructions  Monitor your condition for any changes.  Drink enough fluid to keep your urine clear or pale yellow.  Go to the bathroom more often to keep your bladder empty.  If you are female, keep the area around your vagina and rectum clean. Wipe yourself from front to back after urinating.  Keep all follow-up visits as told by your health care provider. This is important. Contact a health care provider if:  You notice any new symptoms, such as back pain or burning while urinating. Get help right away if:  You develop signs of an infection such as: ? A burning sensation when you urinate. ? Have pain when you urinate. ? Develop an intense need to urinate. ? Urinating more frequently. ? Back pain or pelvic pain. ? Fever or chills.  You have blood in your urine.  Your urine becomes discolored or cloudy.  Your urine smells bad.  You have severe pain that cannot be controlled with medicine. Summary  Asymptomatic bacteriuria is the presence of a large number of bacteria in the urine without the usual symptoms of burning or frequent urination.  Usually, treatment is not needed for this condition. Treating the condition can lead to other problems, such as too much yeast and the growth of antibiotic-resistant bacteria.  Some people, such as pregnant women and people with kidney transplants, do need treatment with antibiotic medicines to prevent   kidney infection (pyelonephritis).  If you were prescribed an antibiotic medicine, take it as told by your health care provider. Do not stop taking the antibiotic even if you start to feel better. This information is not intended to replace advice given to you by your health care provider. Make sure you  discuss any questions you have with your health care provider. Document Released: 09/06/2005 Document Revised: 08/31/2016 Document Reviewed: 08/31/2016 Elsevier Interactive Patient Education  2017 Elsevier Inc.  

## 2017-07-16 LAB — COMPREHENSIVE METABOLIC PANEL
ALBUMIN: 4 g/dL (ref 3.5–5.5)
ALT: 24 IU/L (ref 0–32)
AST: 19 IU/L (ref 0–40)
Albumin/Globulin Ratio: 1.7 (ref 1.2–2.2)
Alkaline Phosphatase: 57 IU/L (ref 39–117)
BILIRUBIN TOTAL: 0.2 mg/dL (ref 0.0–1.2)
BUN/Creatinine Ratio: 12 (ref 9–23)
BUN: 7 mg/dL (ref 6–20)
CALCIUM: 8.9 mg/dL (ref 8.7–10.2)
CHLORIDE: 102 mmol/L (ref 96–106)
CO2: 20 mmol/L (ref 20–29)
Creatinine, Ser: 0.57 mg/dL (ref 0.57–1.00)
GFR, EST AFRICAN AMERICAN: 144 mL/min/{1.73_m2} (ref >59)
GFR, EST NON AFRICAN AMERICAN: 125 mL/min/{1.73_m2} (ref >59)
GLUCOSE: 85 mg/dL (ref 65–99)
Globulin, Total: 2.3 g/dL (ref 1.5–4.5)
Potassium: 3.8 mmol/L (ref 3.5–5.2)
Sodium: 138 mmol/L (ref 134–144)
TOTAL PROTEIN: 6.3 g/dL (ref 6.0–8.5)

## 2017-07-16 LAB — BILE ACIDS, TOTAL: Bile Acids Total: 5.2 umol/L (ref 4.7–24.5)

## 2017-07-18 ENCOUNTER — Ambulatory Visit (HOSPITAL_COMMUNITY)
Admission: RE | Admit: 2017-07-18 | Discharge: 2017-07-18 | Disposition: A | Discharge status: Home / Self Care | Payer: Medicaid Other | Source: Ambulatory Visit | Attending: Student | Admitting: Student

## 2017-07-18 ENCOUNTER — Other Ambulatory Visit: Payer: Self-pay | Admitting: Student

## 2017-07-18 DIAGNOSIS — Z362 Encounter for other antenatal screening follow-up: Secondary | ICD-10-CM | POA: Insufficient documentation

## 2017-07-18 DIAGNOSIS — IMO0002 Reserved for concepts with insufficient information to code with codable children: Secondary | ICD-10-CM

## 2017-07-18 DIAGNOSIS — Z0489 Encounter for examination and observation for other specified reasons: Secondary | ICD-10-CM

## 2017-07-18 DIAGNOSIS — Z3480 Encounter for supervision of other normal pregnancy, unspecified trimester: Secondary | ICD-10-CM

## 2017-07-18 DIAGNOSIS — O99212 Obesity complicating pregnancy, second trimester: Secondary | ICD-10-CM | POA: Insufficient documentation

## 2017-07-18 DIAGNOSIS — E669 Obesity, unspecified: Secondary | ICD-10-CM | POA: Diagnosis not present

## 2017-07-18 DIAGNOSIS — Z3A19 19 weeks gestation of pregnancy: Secondary | ICD-10-CM

## 2017-09-05 NOTE — Progress Notes (Signed)
Needs to take BP with BRX equipment 

## 2017-09-06 ENCOUNTER — Ambulatory Visit (INDEPENDENT_AMBULATORY_CARE_PROVIDER_SITE_OTHER): Payer: Medicaid Other | Admitting: Obstetrics and Gynecology

## 2017-09-06 VITALS — BP 103/69 | HR 86 | Wt 183.0 lb

## 2017-09-06 DIAGNOSIS — Z3480 Encounter for supervision of other normal pregnancy, unspecified trimester: Secondary | ICD-10-CM

## 2017-09-06 DIAGNOSIS — R8271 Bacteriuria: Secondary | ICD-10-CM

## 2017-09-06 DIAGNOSIS — O9989 Other specified diseases and conditions complicating pregnancy, childbirth and the puerperium: Secondary | ICD-10-CM

## 2017-09-06 DIAGNOSIS — Z3482 Encounter for supervision of other normal pregnancy, second trimester: Secondary | ICD-10-CM

## 2017-09-06 NOTE — Progress Notes (Addendum)
Prenatal Visit Note Date: 09/06/2017 Clinic: [facility]  Subjective:  Michelle Riddle is a 31 y.o. G2P1001 at 6387w6d being seen today for ongoing prenatal care.  She is currently monitored for the following issues for this low-risk pregnancy and has Infertility, female; Secondary amenorrhea; Supervision of other normal pregnancy, antepartum; Morning sickness; Asymptomatic bacteriuria during pregnancy in first trimester; and Pruritic folliculitis of pregnancy on their problem list.  Patient reports occasional b/l leg numbness on sides. .   Contractions: Not present. Vag. Bleeding: None.  Movement: Present. Denies leaking of fluid.   The following portions of the patient's history were reviewed and updated as appropriate: allergies, current medications, past family history, past medical history, past social history, past surgical history and problem list. Problem list updated.  Objective:   Vitals:   09/06/17 0834  BP: 103/69  Pulse: 86  Weight: 183 lb (83 kg)    Fetal Status: Fetal Heart Rate (bpm): 140 Fundal Height: 28 cm Movement: Present     General:  Alert, oriented and cooperative. Patient is in no acute distress.  Skin: Skin is warm and dry. No rash noted.   Cardiovascular: Normal heart rate noted  Respiratory: Normal respiratory effort, no problems with respiration noted  Abdomen: Soft, gravid, appropriate for gestational age. Pain/Pressure: Absent     Pelvic:  Cervical exam deferred        Extremities: Normal range of motion.  Edema: None  Mental Status: Normal mood and affect. Normal behavior. Normal judgment and thought content.   Urinalysis:      Assessment and Plan:  Pregnancy: G2P1001 at 8987w6d  1. Supervision of other normal pregnancy, antepartum Routine care. ucx toc with 28wk labs. Advised to try belly band to see if that helps with leg numbness but if no relief after 1wk to let Michelle Riddle know.  2. Asymptomatic bacteriuria during pregnancy in first  trimester  Preterm labor symptoms and general obstetric precautions including but not limited to vaginal bleeding, contractions, leaking of fluid and fetal movement were reviewed in detail with the patient. Please refer to After Visit Summary for other counseling recommendations.  No Follow-up on file.   [place] BingPickens, Malayzia Laforte, MD

## 2017-09-08 ENCOUNTER — Encounter: Payer: Self-pay | Admitting: *Deleted

## 2017-09-12 ENCOUNTER — Other Ambulatory Visit: Payer: Medicaid Other

## 2017-09-14 ENCOUNTER — Other Ambulatory Visit: Payer: Medicaid Other

## 2017-09-15 ENCOUNTER — Other Ambulatory Visit: Payer: Medicaid Other

## 2017-09-15 DIAGNOSIS — Z3401 Encounter for supervision of normal first pregnancy, first trimester: Secondary | ICD-10-CM

## 2017-09-16 LAB — CBC
HEMATOCRIT: 34.7 % (ref 34.0–46.6)
HEMOGLOBIN: 11.6 g/dL (ref 11.1–15.9)
MCH: 30.7 pg (ref 26.6–33.0)
MCHC: 33.4 g/dL (ref 31.5–35.7)
MCV: 92 fL (ref 79–97)
Platelets: 258 10*3/uL (ref 150–379)
RBC: 3.78 x10E6/uL (ref 3.77–5.28)
RDW: 13 % (ref 12.3–15.4)
WBC: 7.4 10*3/uL (ref 3.4–10.8)

## 2017-09-16 LAB — RPR: RPR Ser Ql: NONREACTIVE

## 2017-09-16 LAB — GLUCOSE TOLERANCE, 2 HOURS W/ 1HR
GLUCOSE, 1 HOUR: 126 mg/dL (ref 65–179)
GLUCOSE, FASTING: 75 mg/dL (ref 65–91)
Glucose, 2 hour: 63 mg/dL — ABNORMAL LOW (ref 65–152)

## 2017-09-16 LAB — HIV ANTIBODY (ROUTINE TESTING W REFLEX): HIV Screen 4th Generation wRfx: NONREACTIVE

## 2017-10-04 ENCOUNTER — Encounter: Payer: Self-pay | Admitting: Obstetrics and Gynecology

## 2017-10-04 ENCOUNTER — Encounter: Payer: Self-pay | Admitting: *Deleted

## 2017-10-04 ENCOUNTER — Ambulatory Visit (INDEPENDENT_AMBULATORY_CARE_PROVIDER_SITE_OTHER): Payer: Medicaid Other | Admitting: Obstetrics and Gynecology

## 2017-10-04 VITALS — BP 103/69 | HR 78 | Wt 181.0 lb

## 2017-10-04 DIAGNOSIS — Z3480 Encounter for supervision of other normal pregnancy, unspecified trimester: Secondary | ICD-10-CM

## 2017-10-04 DIAGNOSIS — R8271 Bacteriuria: Secondary | ICD-10-CM

## 2017-10-04 DIAGNOSIS — Z3483 Encounter for supervision of other normal pregnancy, third trimester: Secondary | ICD-10-CM

## 2017-10-04 DIAGNOSIS — O9989 Other specified diseases and conditions complicating pregnancy, childbirth and the puerperium: Principal | ICD-10-CM

## 2017-10-04 NOTE — Progress Notes (Signed)
Prenatal Visit Note Date: 10/04/2017 Clinic: [facility]  Subjective:  Michelle Riddle is a 32 y.o. G2P1001 at 3852w6d being seen today for ongoing prenatal care.  She is currently monitored for the following issues for this low-risk pregnancy and has Infertility, female; Secondary amenorrhea; Supervision of other normal pregnancy, antepartum; Morning sickness; Asymptomatic bacteriuria during pregnancy in first trimester; and Pruritic folliculitis of pregnancy on their problem list.  Patient reports no complaints.   Contractions: Not present. Vag. Bleeding: None.  Movement: Present. Denies leaking of fluid.   The following portions of the patient's history were reviewed and updated as appropriate: allergies, current medications, past family history, past medical history, past social history, past surgical history and problem list. Problem list updated.  Objective:   Vitals:   10/04/17 1042  BP: 103/69  Pulse: 78  Weight: 181 lb (82.1 kg)    Fetal Status: Fetal Heart Rate (bpm): 142 Fundal Height: 33 cm Movement: Present     General:  Alert, oriented and cooperative. Patient is in no acute distress.  Skin: Skin is warm and dry. No rash noted.   Cardiovascular: Normal heart rate noted  Respiratory: Normal respiratory effort, no problems with respiration noted  Abdomen: Soft, gravid, appropriate for gestational age. Pain/Pressure: Absent     Pelvic:  Cervical exam deferred        Extremities: Normal range of motion.  Edema: None  Mental Status: Normal mood and affect. Normal behavior. Normal judgment and thought content.   Urinalysis:      Assessment and Plan:  Pregnancy: G2P1001 at 5052w6d  1. Asymptomatic bacteriuria during pregnancy in first trimester toc today - Culture, OB Urine  2. Supervision of other normal pregnancy, antepartum Routine care. R/b/a d/w and btl papers signed today. Pt may want to do larc. F/u at subsequent visits. Husband is truck driver and  pt asking about elective iol. Told her that if still desires it to let us know nv and can ask L&D and partners in practice if okay for 39-40wk elective iol  Preterm labor symptoms and general obstetric precautions including but not limited to vaginal bleeding, contractions, leaking of fluid and fetal movement were reviewed in detail with the patient. Please refer to After Visit Summary for other counseling recommendations.  Return for per babyscripts.   [place] BingPickens, Olly Shiner, MD

## 2017-10-06 LAB — URINE CULTURE, OB REFLEX

## 2017-10-06 LAB — CULTURE, OB URINE

## 2017-11-09 ENCOUNTER — Encounter: Payer: Self-pay | Admitting: Family Medicine

## 2017-11-09 ENCOUNTER — Ambulatory Visit (INDEPENDENT_AMBULATORY_CARE_PROVIDER_SITE_OTHER): Payer: Medicaid Other | Admitting: Family Medicine

## 2017-11-09 ENCOUNTER — Other Ambulatory Visit (HOSPITAL_COMMUNITY)
Admission: RE | Admit: 2017-11-09 | Discharge: 2017-11-09 | Disposition: A | Discharge status: Home / Self Care | Payer: Medicaid Other | Source: Ambulatory Visit | Attending: Family Medicine | Admitting: Family Medicine

## 2017-11-09 VITALS — BP 137/78 | HR 82 | Wt 185.0 lb

## 2017-11-09 DIAGNOSIS — O9989 Other specified diseases and conditions complicating pregnancy, childbirth and the puerperium: Secondary | ICD-10-CM

## 2017-11-09 DIAGNOSIS — R8271 Bacteriuria: Secondary | ICD-10-CM

## 2017-11-09 DIAGNOSIS — Z3480 Encounter for supervision of other normal pregnancy, unspecified trimester: Secondary | ICD-10-CM | POA: Insufficient documentation

## 2017-11-09 LAB — OB RESULTS CONSOLE GBS: GBS: NEGATIVE

## 2017-11-09 NOTE — Progress Notes (Signed)
   PRENATAL VISIT NOTE  Subjective:  Michelle Riddle is a 32 y.o. G2P1001 at 7177w0d being seen today for ongoing prenatal care.  She is currently monitored for the following issues for this low-risk pregnancy and has Infertility, female; Supervision of other normal pregnancy, antepartum; Asymptomatic bacteriuria during pregnancy in first trimester; and Pruritic folliculitis of pregnancy on their problem list.  Patient reports no complaints.  Contractions: Not present.  .  Movement: Present. Denies leaking of fluid.   The following portions of the patient's history were reviewed and updated as appropriate: allergies, current medications, past family history, past medical history, past social history, past surgical history and problem list. Problem list updated.  Objective:   Vitals:   11/09/17 1043  BP: 137/78  Pulse: 82  Weight: 185 lb (83.9 kg)    Fetal Status: Fetal Heart Rate (bpm): 141   Movement: Present     General:  Alert, oriented and cooperative. Patient is in no acute distress.  Skin: Skin is warm and dry. No rash noted.   Cardiovascular: Normal heart rate noted  Respiratory: Normal respiratory effort, no problems with respiration noted  Abdomen: Soft, gravid, appropriate for gestational age.  Pain/Pressure: Absent     Pelvic: Cervical exam deferred        Extremities: Normal range of motion.  Edema: None  Mental Status:  Normal mood and affect. Normal behavior. Normal judgment and thought content.   Assessment and Plan:  Pregnancy: G2P1001 at 7577w0d  1. Asymptomatic bacteriuria during pregnancy in first trimester TOC neg  2. Supervision of other normal pregnancy, antepartum UTD, reviewed PD-IOL vs elective IOl at 40wks if favorable. Patient wants to "know when her baby will come" reviewed normal term labor and risks of IOL. Patient will think about this and is dependent on cervical exam.  - Cervicovaginal ancillary only - Culture, beta strep (group b only)  Preterm labor  symptoms and general obstetric precautions including but not limited to vaginal bleeding, contractions, leaking of fluid and fetal movement were reviewed in detail with the patient. Please refer to After Visit Summary for other counseling recommendations.  Return in about 2 weeks (around 11/23/2017) for Routine prenatal care.   Federico FlakeKimberly Niles Lashika Erker, MD

## 2017-11-09 NOTE — Progress Notes (Signed)
[employer]s Compliant

## 2017-11-10 LAB — CERVICOVAGINAL ANCILLARY ONLY
CHLAMYDIA, DNA PROBE: NEGATIVE
NEISSERIA GONORRHEA: NEGATIVE

## 2017-11-15 LAB — CULTURE, BETA STREP (GROUP B ONLY): Strep Gp B Culture: NEGATIVE

## 2017-11-23 ENCOUNTER — Encounter: Payer: Self-pay | Admitting: Family Medicine

## 2017-11-23 ENCOUNTER — Ambulatory Visit (INDEPENDENT_AMBULATORY_CARE_PROVIDER_SITE_OTHER): Payer: Medicaid Other | Admitting: Family Medicine

## 2017-11-23 VITALS — BP 111/77 | HR 82 | Wt 186.5 lb

## 2017-11-23 DIAGNOSIS — O99891 Other specified diseases and conditions complicating pregnancy: Secondary | ICD-10-CM

## 2017-11-23 DIAGNOSIS — R8271 Bacteriuria: Secondary | ICD-10-CM

## 2017-11-23 DIAGNOSIS — O9989 Other specified diseases and conditions complicating pregnancy, childbirth and the puerperium: Secondary | ICD-10-CM

## 2017-11-23 DIAGNOSIS — Z3480 Encounter for supervision of other normal pregnancy, unspecified trimester: Secondary | ICD-10-CM

## 2017-11-23 NOTE — Patient Instructions (Signed)
Come to the MAU (maternity admission unit) for 1) Strong contractions every 2-3 minutes for at least 1 hour that do not go away when you drink water or take a warm shower. These contractions will be so strong all you can do is breath through them 2) Vaginal bleeding- anything more than spotting 3) Loss of fluid like you broke your water 4) Decreased movement of your baby     To help with labor you can 1. Have sex 2. Nipple stimulation 3. Spicy foods   You can try an supplement called Evening Primrose Oil - Take1 pill three time per day and 2 pills in the vagina at night.

## 2017-11-23 NOTE — Progress Notes (Signed)
   PRENATAL VISIT NOTE  Subjective:  Michelle Riddle is a 32 y.o. G2P1001 at 8148w0d being seen today for ongoing prenatal care.  She is currently monitored for the following issues for this low-risk pregnancy and has Infertility, female; Supervision of other normal pregnancy, antepartum; Asymptomatic bacteriuria during pregnancy in first trimester; and Pruritic folliculitis of pregnancy on their problem list.  Patient reports no complaints.  Contractions: Not present. Vag. Bleeding: None.  Movement: Present. Denies leaking of fluid.   The following portions of the patient's history were reviewed and updated as appropriate: allergies, current medications, past family history, past medical history, past social history, past surgical history and problem list. Problem list updated.  Objective:   Vitals:   11/23/17 1105  BP: 111/77  Pulse: 82  Weight: 186 lb 8 oz (84.6 kg)    Fetal Status: Fetal Heart Rate (bpm): 145 Fundal Height: 38 cm Movement: Present  Presentation: Vertex  General:  Alert, oriented and cooperative. Patient is in no acute distress.  Skin: Skin is warm and dry. No rash noted.   Cardiovascular: Normal heart rate noted  Respiratory: Normal respiratory effort, no problems with respiration noted  Abdomen: Soft, gravid, appropriate for gestational age.  Pain/Pressure: Absent     Pelvic: Cervical exam performed Dilation: Fingertip Effacement (%): Thick Station: -3  Extremities: Normal range of motion.  Edema: None  Mental Status:  Normal mood and affect. Normal behavior. Normal judgment and thought content.   Assessment and Plan:  Pregnancy: G2P1001 at 6148w0d  1. Supervision of other normal pregnancy, antepartum UTD today  2. Asymptomatic bacteriuria during pregnancy in first trimester TOC neg   Term labor symptoms and general obstetric precautions including but not limited to vaginal bleeding, contractions, leaking of fluid and fetal movement were reviewed in detail with  the patient. Please refer to After Visit Summary for other counseling recommendations.  Return in about 1 week (around 11/30/2017) for Routine prenatal care.   Federico FlakeKimberly Niles [place] Marsalis, MD

## 2017-11-30 ENCOUNTER — Ambulatory Visit (INDEPENDENT_AMBULATORY_CARE_PROVIDER_SITE_OTHER): Payer: Medicaid Other | Admitting: Obstetrics and Gynecology

## 2017-11-30 ENCOUNTER — Encounter: Payer: Self-pay | Admitting: Obstetrics and Gynecology

## 2017-11-30 DIAGNOSIS — Z3483 Encounter for supervision of other normal pregnancy, third trimester: Secondary | ICD-10-CM

## 2017-11-30 DIAGNOSIS — Z3480 Encounter for supervision of other normal pregnancy, unspecified trimester: Secondary | ICD-10-CM

## 2017-11-30 NOTE — Progress Notes (Signed)
  BP readings in [employer]s

## 2017-11-30 NOTE — Progress Notes (Signed)
Prenatal Visit Note Date: 11/30/2017 Clinic: [facility]  Subjective:  Michelle Riddle is a 32 y.o. G2P1001 at 709w0d being seen today for ongoing prenatal care.  She is currently monitored for the following issues for this low-risk pregnancy and has Infertility, female; Supervision of other normal pregnancy, antepartum; Asymptomatic bacteriuria during pregnancy in first trimester; and Pruritic folliculitis of pregnancy on their problem list.  Patient reports no complaints.   Contractions: Irregular. Vag. Bleeding: None.  Movement: Present. Denies leaking of fluid.   The following portions of the patient's history were reviewed and updated as appropriate: allergies, current medications, past family history, past medical history, past social history, past surgical history and problem list. Problem list updated.  Objective:   Vitals:   11/30/17 1104  BP: 128/72  Weight: 185 lb (83.9 kg)    Fetal Status: Fetal Heart Rate (bpm): 153 Fundal Height: 39 cm Movement: Present  Presentation: Vertex  General:  Alert, oriented and cooperative. Patient is in no acute distress.  Skin: Skin is warm and dry. No rash noted.   Cardiovascular: Normal heart rate noted  Respiratory: Normal respiratory effort, no problems with respiration noted  Abdomen: Soft, gravid, appropriate for gestational age. Pain/Pressure: Present     Pelvic:  Cervical exam performed Dilation: 1 Effacement (%): 50 Station: -1  Extremities: Normal range of motion.  Edema: Trace  Mental Status: Normal mood and affect. Normal behavior. Normal judgment and thought content.   Urinalysis:      Assessment and Plan:  Pregnancy: G2P1001 at 519w0d  Routine care. Membranes swept. Set up for pdiol at 41wks  Term labor symptoms and general obstetric precautions including but not limited to vaginal bleeding, contractions, leaking of fluid and fetal movement were reviewed in detail with the patient. Please refer to After  Visit Summary for other counseling recommendations.  Return in about 1 week (around 12/07/2017) for rob, nst/afi.   Cayucos BingPickens, Toniesha Zellner, MD

## 2017-12-01 ENCOUNTER — Telehealth (HOSPITAL_COMMUNITY): Payer: Self-pay | Admitting: *Deleted

## 2017-12-01 NOTE — Telephone Encounter (Signed)
Preadmission screen  

## 2017-12-05 ENCOUNTER — Encounter: Payer: Self-pay | Admitting: Radiology

## 2017-12-07 ENCOUNTER — Ambulatory Visit (INDEPENDENT_AMBULATORY_CARE_PROVIDER_SITE_OTHER): Payer: Medicaid Other | Admitting: Family Medicine

## 2017-12-07 ENCOUNTER — Encounter: Payer: Self-pay | Admitting: Family Medicine

## 2017-12-07 VITALS — BP 118/66 | HR 84 | Wt 185.0 lb

## 2017-12-07 DIAGNOSIS — O480 Post-term pregnancy: Secondary | ICD-10-CM | POA: Insufficient documentation

## 2017-12-07 DIAGNOSIS — Z3480 Encounter for supervision of other normal pregnancy, unspecified trimester: Secondary | ICD-10-CM

## 2017-12-07 NOTE — Progress Notes (Signed)
   PRENATAL VISIT NOTE  Subjective:  Michelle Riddle is a 32 y.o. G2P1001 at 5643w0d being seen today for ongoing prenatal care.  She is currently monitored for the following issues for this low-risk pregnancy and has Infertility, female; Supervision of other normal pregnancy, antepartum; Asymptomatic bacteriuria during pregnancy in first trimester; Pruritic folliculitis of pregnancy; and Post-term pregnancy, 40-42 weeks of gestation on their problem list.  Patient reports no complaints.  Contractions: Irregular. Vag. Bleeding: None.  Movement: Present. Denies leaking of fluid.   The following portions of the patient's history were reviewed and updated as appropriate: allergies, current medications, past family history, past medical history, past social history, past surgical history and problem list. Problem list updated.  Objective:   Vitals:   12/07/17 1035  BP: 118/66  Pulse: 84  Weight: 185 lb (83.9 kg)    Fetal Status: Fetal Heart Rate (bpm): NST   Movement: Present     General:  Alert, oriented and cooperative. Patient is in no acute distress.  Skin: Skin is warm and dry. No rash noted.   Cardiovascular: Normal heart rate noted  Respiratory: Normal respiratory effort, no problems with respiration noted  Abdomen: Soft, gravid, appropriate for gestational age.  Pain/Pressure: Present     Pelvic: Cervical exam performed        Extremities: Normal range of motion.  Edema: Trace  Mental Status:  Normal mood and affect. Normal behavior. Normal judgment and thought content.   Assessment and Plan:  Pregnancy: G2P1001 at 2043w0d  1. Post-term pregnancy, 40-42 weeks of gestation - Fetal nonstress test  I reviewed the patient's fetal monitoring.  Baseline HR: 140 Variability:  moderate Accels:present Decels: none  A/P: Reactive NST.  Reassured regarding fetal status.   2. Supervision of other normal pregnancy, antepartum IOL scheduled for 3/27 Offered membranes sweeping today.  Reviewed risks, benefits of procedure. Reviewed in detail labor precautions and reasons to seek care. Discussed that cramping and spotting can be normal after membrane sweeping  Term labor symptoms and general obstetric precautions including but not limited to vaginal bleeding, contractions, leaking of fluid and fetal movement were reviewed in detail with the patient. Please refer to After Visit Summary for other counseling recommendations.  Return in about 3 days (around 12/10/2017) for NST.   Federico FlakeKimberly Niles Arvil Utz, MD

## 2017-12-07 NOTE — Progress Notes (Signed)
Induction has been scheduled for 12/14/17

## 2017-12-12 ENCOUNTER — Ambulatory Visit (INDEPENDENT_AMBULATORY_CARE_PROVIDER_SITE_OTHER): Payer: Medicaid Other | Admitting: *Deleted

## 2017-12-12 VITALS — BP 119/74 | HR 75 | Wt 185.0 lb

## 2017-12-12 DIAGNOSIS — O480 Post-term pregnancy: Secondary | ICD-10-CM | POA: Diagnosis not present

## 2017-12-12 DIAGNOSIS — Z3480 Encounter for supervision of other normal pregnancy, unspecified trimester: Secondary | ICD-10-CM

## 2017-12-12 NOTE — Progress Notes (Signed)
Pt here for NST only d/t postdates pregnancy - induction scheduled for Wednesday

## 2017-12-13 ENCOUNTER — Inpatient Hospital Stay (HOSPITAL_COMMUNITY): Payer: Medicaid Other | Admitting: Anesthesiology

## 2017-12-13 ENCOUNTER — Inpatient Hospital Stay (HOSPITAL_COMMUNITY)
Admission: AD | Admit: 2017-12-13 | Discharge: 2017-12-15 | DRG: 785 | Disposition: A | Discharge status: Home / Self Care | Payer: Medicaid Other | Source: Ambulatory Visit | Attending: Family Medicine | Admitting: Family Medicine

## 2017-12-13 ENCOUNTER — Other Ambulatory Visit: Payer: Self-pay

## 2017-12-13 ENCOUNTER — Encounter (HOSPITAL_COMMUNITY): Payer: Self-pay | Admitting: *Deleted

## 2017-12-13 ENCOUNTER — Encounter (HOSPITAL_COMMUNITY)
Admission: AD | Disposition: A | Discharge status: Home / Self Care | Payer: Self-pay | Source: Ambulatory Visit | Attending: Family Medicine

## 2017-12-13 DIAGNOSIS — Z3A40 40 weeks gestation of pregnancy: Secondary | ICD-10-CM

## 2017-12-13 DIAGNOSIS — O480 Post-term pregnancy: Principal | ICD-10-CM | POA: Diagnosis present

## 2017-12-13 DIAGNOSIS — Z23 Encounter for immunization: Secondary | ICD-10-CM | POA: Diagnosis not present

## 2017-12-13 DIAGNOSIS — F17210 Nicotine dependence, cigarettes, uncomplicated: Secondary | ICD-10-CM | POA: Diagnosis present

## 2017-12-13 DIAGNOSIS — Z3480 Encounter for supervision of other normal pregnancy, unspecified trimester: Secondary | ICD-10-CM

## 2017-12-13 DIAGNOSIS — O99334 Smoking (tobacco) complicating childbirth: Secondary | ICD-10-CM | POA: Diagnosis present

## 2017-12-13 DIAGNOSIS — Z302 Encounter for sterilization: Secondary | ICD-10-CM

## 2017-12-13 DIAGNOSIS — Z98891 History of uterine scar from previous surgery: Secondary | ICD-10-CM

## 2017-12-13 LAB — CBC
HEMATOCRIT: 32.9 % — AB (ref 36.0–46.0)
HEMOGLOBIN: 11.1 g/dL — AB (ref 12.0–15.0)
MCH: 27.2 pg (ref 26.0–34.0)
MCHC: 33.7 g/dL (ref 30.0–36.0)
MCV: 80.6 fL (ref 78.0–100.0)
Platelets: 255 10*3/uL (ref 150–400)
RBC: 4.08 MIL/uL (ref 3.87–5.11)
RDW: 13.5 % (ref 11.5–15.5)
WBC: 9.7 10*3/uL (ref 4.0–10.5)

## 2017-12-13 LAB — TYPE AND SCREEN
ABO/RH(D): O POS
Antibody Screen: NEGATIVE

## 2017-12-13 LAB — RPR: RPR Ser Ql: NONREACTIVE

## 2017-12-13 SURGERY — Surgical Case
Anesthesia: Epidural | Site: Abdomen | Wound class: Clean Contaminated

## 2017-12-13 MED ORDER — DIPHENHYDRAMINE HCL 50 MG/ML IJ SOLN: 12.5000 mg | INTRAMUSCULAR | Status: DC | PRN

## 2017-12-13 MED ORDER — EPHEDRINE 5 MG/ML INJ: 10.0000 mg | INTRAVENOUS | Status: DC | PRN

## 2017-12-13 MED ORDER — LIDOCAINE HCL (PF) 1 % IJ SOLN
30.0000 mL | INTRAMUSCULAR | Status: DC | PRN
  Filled 2017-12-13: qty 30

## 2017-12-13 MED ORDER — OXYCODONE HCL 5 MG/5ML PO SOLN: 5.0000 mg | Freq: Once | ORAL | Status: DC | PRN

## 2017-12-13 MED ORDER — PHENYLEPHRINE 40 MCG/ML (10ML) SYRINGE FOR IV PUSH (FOR BLOOD PRESSURE SUPPORT): 80.0000 ug | PREFILLED_SYRINGE | INTRAVENOUS | Status: DC | PRN

## 2017-12-13 MED ORDER — SENNOSIDES-DOCUSATE SODIUM 8.6-50 MG PO TABS
2.0000 | ORAL_TABLET | ORAL | Status: DC
  Administered 2017-12-13 – 2017-12-15 (×2): 2 via ORAL
  Filled 2017-12-13 (×2): qty 2

## 2017-12-13 MED ORDER — METOCLOPRAMIDE HCL 5 MG/ML IJ SOLN
INTRAMUSCULAR | Status: AC
  Filled 2017-12-13: qty 2

## 2017-12-13 MED ORDER — TETANUS-DIPHTH-ACELL PERTUSSIS 5-2.5-18.5 LF-MCG/0.5 IM SUSP
0.5000 mL | Freq: Once | INTRAMUSCULAR | Status: AC
  Administered 2017-12-14: 0.5 mL via INTRAMUSCULAR
  Filled 2017-12-13: qty 0.5

## 2017-12-13 MED ORDER — OXYCODONE-ACETAMINOPHEN 5-325 MG PO TABS: 1.0000 | ORAL_TABLET | ORAL | Status: DC | PRN

## 2017-12-13 MED ORDER — TERBUTALINE SULFATE 1 MG/ML IJ SOLN: 0.2500 mg | Freq: Once | INTRAMUSCULAR | Status: DC | PRN

## 2017-12-13 MED ORDER — OXYCODONE-ACETAMINOPHEN 5-325 MG PO TABS: 2.0000 | ORAL_TABLET | ORAL | Status: DC | PRN

## 2017-12-13 MED ORDER — SODIUM BICARBONATE 8.4 % IV SOLN
INTRAVENOUS | Status: DC | PRN
  Administered 2017-12-13 (×3): 5 mL via EPIDURAL

## 2017-12-13 MED ORDER — INFLUENZA VAC SPLIT QUAD 0.5 ML IM SUSY
0.5000 mL | PREFILLED_SYRINGE | INTRAMUSCULAR | Status: AC
  Administered 2017-12-14: 0.5 mL via INTRAMUSCULAR
  Filled 2017-12-13: qty 0.5

## 2017-12-13 MED ORDER — ACETAMINOPHEN 325 MG PO TABS: 650.0000 mg | ORAL_TABLET | ORAL | Status: DC | PRN

## 2017-12-13 MED ORDER — IBUPROFEN 600 MG PO TABS
600.0000 mg | ORAL_TABLET | Freq: Four times a day (QID) | ORAL | Status: DC
  Filled 2017-12-13: qty 1

## 2017-12-13 MED ORDER — ONDANSETRON HCL 4 MG/2ML IJ SOLN
INTRAMUSCULAR | Status: AC
  Filled 2017-12-13: qty 2

## 2017-12-13 MED ORDER — STERILE WATER FOR IRRIGATION IR SOLN
Status: DC | PRN
  Administered 2017-12-13: 1000 mL

## 2017-12-13 MED ORDER — ONDANSETRON HCL 4 MG/2ML IJ SOLN: 4.0000 mg | Freq: Four times a day (QID) | INTRAMUSCULAR | Status: DC | PRN

## 2017-12-13 MED ORDER — ZOLPIDEM TARTRATE 5 MG PO TABS: 5.0000 mg | ORAL_TABLET | Freq: Every evening | ORAL | Status: DC | PRN

## 2017-12-13 MED ORDER — METOCLOPRAMIDE HCL 5 MG/ML IJ SOLN
INTRAMUSCULAR | Status: DC | PRN
  Administered 2017-12-13 (×2): 5 mg via INTRAVENOUS

## 2017-12-13 MED ORDER — FLEET ENEMA 7-19 GM/118ML RE ENEM: 1.0000 | ENEMA | RECTAL | Status: DC | PRN

## 2017-12-13 MED ORDER — LIDOCAINE HCL (PF) 1 % IJ SOLN
INTRAMUSCULAR | Status: DC | PRN
  Administered 2017-12-13: 13 mL via EPIDURAL

## 2017-12-13 MED ORDER — PNEUMOCOCCAL VAC POLYVALENT 25 MCG/0.5ML IJ INJ: 0.5000 mL | INJECTION | INTRAMUSCULAR | Status: DC

## 2017-12-13 MED ORDER — SODIUM CHLORIDE 0.9% FLUSH: 3.0000 mL | INTRAVENOUS | Status: DC | PRN

## 2017-12-13 MED ORDER — DEXAMETHASONE SODIUM PHOSPHATE 4 MG/ML IJ SOLN
INTRAMUSCULAR | Status: AC
  Filled 2017-12-13: qty 1

## 2017-12-13 MED ORDER — NALBUPHINE HCL 10 MG/ML IJ SOLN: 5.0000 mg | Freq: Once | INTRAMUSCULAR | Status: DC | PRN

## 2017-12-13 MED ORDER — PHENYLEPHRINE 40 MCG/ML (10ML) SYRINGE FOR IV PUSH (FOR BLOOD PRESSURE SUPPORT)
PREFILLED_SYRINGE | INTRAVENOUS | Status: DC | PRN
  Administered 2017-12-13 (×3): 80 ug via INTRAVENOUS
  Administered 2017-12-13: 60 ug via INTRAVENOUS
  Administered 2017-12-13: 80 ug via INTRAVENOUS
  Administered 2017-12-13 (×3): 60 ug via INTRAVENOUS

## 2017-12-13 MED ORDER — CEFAZOLIN SODIUM-DEXTROSE 2-4 GM/100ML-% IV SOLN
INTRAVENOUS | Status: AC
  Filled 2017-12-13: qty 100

## 2017-12-13 MED ORDER — MORPHINE SULFATE (PF) 0.5 MG/ML IJ SOLN
INTRAMUSCULAR | Status: AC
  Filled 2017-12-13: qty 10

## 2017-12-13 MED ORDER — OXYCODONE HCL 5 MG PO TABS: 5.0000 mg | ORAL_TABLET | Freq: Once | ORAL | Status: DC | PRN

## 2017-12-13 MED ORDER — SIMETHICONE 80 MG PO CHEW
80.0000 mg | CHEWABLE_TABLET | Freq: Three times a day (TID) | ORAL | Status: DC
  Administered 2017-12-14 – 2017-12-15 (×3): 80 mg via ORAL
  Filled 2017-12-13 (×4): qty 1

## 2017-12-13 MED ORDER — NALBUPHINE HCL 10 MG/ML IJ SOLN: 5.0000 mg | INTRAMUSCULAR | Status: DC | PRN

## 2017-12-13 MED ORDER — PHENYLEPHRINE 40 MCG/ML (10ML) SYRINGE FOR IV PUSH (FOR BLOOD PRESSURE SUPPORT)
PREFILLED_SYRINGE | INTRAVENOUS | Status: AC
  Filled 2017-12-13: qty 20

## 2017-12-13 MED ORDER — DIBUCAINE 1 % RE OINT: 1.0000 "application " | TOPICAL_OINTMENT | RECTAL | Status: DC | PRN

## 2017-12-13 MED ORDER — OXYTOCIN 40 UNITS IN LACTATED RINGERS INFUSION - SIMPLE MED
2.5000 [IU]/h | INTRAVENOUS | Status: DC
  Filled 2017-12-13: qty 1000

## 2017-12-13 MED ORDER — SIMETHICONE 80 MG PO CHEW
80.0000 mg | CHEWABLE_TABLET | ORAL | Status: DC
  Administered 2017-12-13 – 2017-12-15 (×2): 80 mg via ORAL
  Filled 2017-12-13 (×2): qty 1

## 2017-12-13 MED ORDER — FENTANYL 2.5 MCG/ML BUPIVACAINE 1/10 % EPIDURAL INFUSION (WH - ANES): 14.0000 mL/h | INTRAMUSCULAR | Status: DC | PRN

## 2017-12-13 MED ORDER — SCOPOLAMINE 1 MG/3DAYS TD PT72
MEDICATED_PATCH | TRANSDERMAL | Status: AC
  Filled 2017-12-13: qty 1

## 2017-12-13 MED ORDER — OXYTOCIN 40 UNITS IN LACTATED RINGERS INFUSION - SIMPLE MED
1.0000 m[IU]/min | INTRAVENOUS | Status: DC
  Administered 2017-12-13: 2 m[IU]/min via INTRAVENOUS

## 2017-12-13 MED ORDER — ACETAMINOPHEN 325 MG PO TABS
650.0000 mg | ORAL_TABLET | ORAL | Status: DC | PRN
  Administered 2017-12-14 – 2017-12-15 (×2): 650 mg via ORAL
  Filled 2017-12-13 (×2): qty 2

## 2017-12-13 MED ORDER — OXYTOCIN 40 UNITS IN LACTATED RINGERS INFUSION - SIMPLE MED: 2.5000 [IU]/h | INTRAVENOUS | Status: AC

## 2017-12-13 MED ORDER — NALOXONE HCL 0.4 MG/ML IJ SOLN: 0.4000 mg | INTRAMUSCULAR | Status: DC | PRN

## 2017-12-13 MED ORDER — KETOROLAC TROMETHAMINE 30 MG/ML IJ SOLN
30.0000 mg | Freq: Once | INTRAMUSCULAR | Status: DC | PRN
  Administered 2017-12-13: 30 mg via INTRAVENOUS

## 2017-12-13 MED ORDER — MORPHINE SULFATE (PF) 0.5 MG/ML IJ SOLN
INTRAMUSCULAR | Status: DC | PRN
  Administered 2017-12-13: 3 mg via EPIDURAL

## 2017-12-13 MED ORDER — KETOROLAC TROMETHAMINE 30 MG/ML IJ SOLN
INTRAMUSCULAR | Status: AC
  Filled 2017-12-13: qty 1

## 2017-12-13 MED ORDER — DEXAMETHASONE SODIUM PHOSPHATE 4 MG/ML IJ SOLN
INTRAMUSCULAR | Status: DC | PRN
  Administered 2017-12-13: 4 mg via INTRAVENOUS

## 2017-12-13 MED ORDER — SODIUM CHLORIDE 0.9 % IR SOLN
Status: DC | PRN
  Administered 2017-12-13: 1000 mL

## 2017-12-13 MED ORDER — SOD CITRATE-CITRIC ACID 500-334 MG/5ML PO SOLN
30.0000 mL | ORAL | Status: DC | PRN
  Administered 2017-12-13: 30 mL via ORAL
  Filled 2017-12-13 (×2): qty 15

## 2017-12-13 MED ORDER — IBUPROFEN 600 MG PO TABS: 600.0000 mg | ORAL_TABLET | Freq: Four times a day (QID) | ORAL | Status: DC

## 2017-12-13 MED ORDER — HYDROMORPHONE HCL 1 MG/ML IJ SOLN: 0.2500 mg | INTRAMUSCULAR | Status: DC | PRN

## 2017-12-13 MED ORDER — FENTANYL 2.5 MCG/ML BUPIVACAINE 1/10 % EPIDURAL INFUSION (WH - ANES)
14.0000 mL/h | INTRAMUSCULAR | Status: DC | PRN
  Administered 2017-12-13: 14 mL/h via EPIDURAL
  Filled 2017-12-13: qty 100

## 2017-12-13 MED ORDER — NALOXONE HCL 4 MG/10ML IJ SOLN
1.0000 ug/kg/h | INTRAVENOUS | Status: DC | PRN
  Filled 2017-12-13: qty 5

## 2017-12-13 MED ORDER — DIPHENHYDRAMINE HCL 25 MG PO CAPS: 25.0000 mg | ORAL_CAPSULE | Freq: Four times a day (QID) | ORAL | Status: DC | PRN

## 2017-12-13 MED ORDER — DIPHENHYDRAMINE HCL 25 MG PO CAPS
25.0000 mg | ORAL_CAPSULE | ORAL | Status: DC | PRN
  Filled 2017-12-13: qty 1

## 2017-12-13 MED ORDER — LACTATED RINGERS IV SOLN
INTRAVENOUS | Status: DC
  Administered 2017-12-13 (×3): via INTRAVENOUS

## 2017-12-13 MED ORDER — LACTATED RINGERS IV SOLN
500.0000 mL | INTRAVENOUS | Status: DC | PRN
  Administered 2017-12-13: 500 mL via INTRAVENOUS

## 2017-12-13 MED ORDER — PHENYLEPHRINE 40 MCG/ML (10ML) SYRINGE FOR IV PUSH (FOR BLOOD PRESSURE SUPPORT)
80.0000 ug | PREFILLED_SYRINGE | INTRAVENOUS | Status: DC | PRN
  Filled 2017-12-13: qty 10

## 2017-12-13 MED ORDER — SCOPOLAMINE 1 MG/3DAYS TD PT72
1.0000 | MEDICATED_PATCH | Freq: Once | TRANSDERMAL | Status: DC
  Administered 2017-12-13: 1.5 mg via TRANSDERMAL

## 2017-12-13 MED ORDER — AZITHROMYCIN 500 MG IV SOLR
500.0000 mg | Freq: Once | INTRAVENOUS | Status: AC
  Administered 2017-12-13: 500 mg via INTRAVENOUS
  Filled 2017-12-13: qty 500

## 2017-12-13 MED ORDER — OXYCODONE-ACETAMINOPHEN 5-325 MG PO TABS
1.0000 | ORAL_TABLET | ORAL | Status: DC | PRN
  Administered 2017-12-14 – 2017-12-15 (×2): 1 via ORAL
  Filled 2017-12-13 (×2): qty 1

## 2017-12-13 MED ORDER — ONDANSETRON HCL 4 MG/2ML IJ SOLN: 4.0000 mg | Freq: Three times a day (TID) | INTRAMUSCULAR | Status: DC | PRN

## 2017-12-13 MED ORDER — LACTATED RINGERS IV SOLN
500.0000 mL | Freq: Once | INTRAVENOUS | Status: AC
  Administered 2017-12-13: 500 mL via INTRAVENOUS

## 2017-12-13 MED ORDER — SIMETHICONE 80 MG PO CHEW
80.0000 mg | CHEWABLE_TABLET | ORAL | Status: DC | PRN
  Administered 2017-12-14: 80 mg via ORAL

## 2017-12-13 MED ORDER — MEPERIDINE HCL 25 MG/ML IJ SOLN
INTRAMUSCULAR | Status: AC
  Filled 2017-12-13: qty 1

## 2017-12-13 MED ORDER — PRENATAL MULTIVITAMIN CH: 1.0000 | ORAL_TABLET | Freq: Every day | ORAL | Status: DC

## 2017-12-13 MED ORDER — LACTATED RINGERS IV SOLN: 500.0000 mL | Freq: Once | INTRAVENOUS | Status: DC

## 2017-12-13 MED ORDER — MEPERIDINE HCL 25 MG/ML IJ SOLN
6.2500 mg | INTRAMUSCULAR | Status: DC | PRN
  Administered 2017-12-13: 6.25 mg via INTRAVENOUS

## 2017-12-13 MED ORDER — ENOXAPARIN SODIUM 40 MG/0.4ML ~~LOC~~ SOLN
40.0000 mg | SUBCUTANEOUS | Status: DC
  Administered 2017-12-14 – 2017-12-15 (×2): 40 mg via SUBCUTANEOUS
  Filled 2017-12-13 (×3): qty 0.4

## 2017-12-13 MED ORDER — LACTATED RINGERS IV SOLN
INTRAVENOUS | Status: DC | PRN
  Administered 2017-12-13 (×4): via INTRAVENOUS

## 2017-12-13 MED ORDER — MISOPROSTOL 25 MCG QUARTER TABLET: 25.0000 ug | ORAL_TABLET | ORAL | Status: DC | PRN

## 2017-12-13 MED ORDER — PRENATAL 27-0.8 MG PO TABS: 1.0000 | ORAL_TABLET | Freq: Every day | ORAL | Status: DC

## 2017-12-13 MED ORDER — COCONUT OIL OIL: 1.0000 "application " | TOPICAL_OIL | Status: DC | PRN

## 2017-12-13 MED ORDER — OXYTOCIN 10 UNIT/ML IJ SOLN
INTRAVENOUS | Status: DC | PRN
  Administered 2017-12-13: 40 [IU] via INTRAVENOUS

## 2017-12-13 MED ORDER — LACTATED RINGERS IV SOLN
INTRAVENOUS | Status: DC
  Administered 2017-12-13 – 2017-12-14 (×2): via INTRAVENOUS

## 2017-12-13 MED ORDER — FENTANYL CITRATE (PF) 100 MCG/2ML IJ SOLN
100.0000 ug | INTRAMUSCULAR | Status: DC | PRN
  Administered 2017-12-13 (×2): 100 ug via INTRAVENOUS
  Filled 2017-12-13 (×2): qty 2

## 2017-12-13 MED ORDER — PROMETHAZINE HCL 25 MG/ML IJ SOLN: 6.2500 mg | INTRAMUSCULAR | Status: DC | PRN

## 2017-12-13 MED ORDER — WITCH HAZEL-GLYCERIN EX PADS: 1.0000 "application " | MEDICATED_PAD | CUTANEOUS | Status: DC | PRN

## 2017-12-13 MED ORDER — ONDANSETRON HCL 4 MG/2ML IJ SOLN
INTRAMUSCULAR | Status: DC | PRN
  Administered 2017-12-13: 4 mg via INTRAVENOUS

## 2017-12-13 MED ORDER — CEFAZOLIN SODIUM-DEXTROSE 2-4 GM/100ML-% IV SOLN
2.0000 g | Freq: Once | INTRAVENOUS | Status: AC
  Administered 2017-12-13: 2 g via INTRAVENOUS

## 2017-12-13 MED ORDER — ALBUMIN HUMAN 5 % IV SOLN
INTRAVENOUS | Status: DC | PRN
  Administered 2017-12-13: 15:00:00 via INTRAVENOUS

## 2017-12-13 MED ORDER — MENTHOL 3 MG MT LOZG
1.0000 | LOZENGE | OROMUCOSAL | Status: DC | PRN
Start: 2017-12-13 — End: 2017-12-15

## 2017-12-13 MED ORDER — LACTATED RINGERS IV SOLN
INTRAVENOUS | Status: DC | PRN
  Administered 2017-12-13: 14:00:00 via INTRAVENOUS

## 2017-12-13 MED ORDER — OXYTOCIN BOLUS FROM INFUSION: 500.0000 mL | Freq: Once | INTRAVENOUS | Status: DC

## 2017-12-13 SURGICAL SUPPLY — 35 items
BENZOIN TINCTURE PRP APPL 2/3 (GAUZE/BANDAGES/DRESSINGS) ×3 IMPLANT
CATH ROBINSON RED A/P 14FR (CATHETERS) ×3 IMPLANT
CHLORAPREP W/TINT 26ML (MISCELLANEOUS) ×3 IMPLANT
CLAMP CORD UMBIL (MISCELLANEOUS) ×3 IMPLANT
CLOSURE STERI-STRIP 1/2X4 (GAUZE/BANDAGES/DRESSINGS) ×1
CLOSURE WOUND 1/2 X4 (GAUZE/BANDAGES/DRESSINGS) ×1
CLOTH BEACON ORANGE TIMEOUT ST (SAFETY) ×3 IMPLANT
CLSR STERI-STRIP ANTIMIC 1/2X4 (GAUZE/BANDAGES/DRESSINGS) ×2 IMPLANT
DRSG OPSITE POSTOP 4X10 (GAUZE/BANDAGES/DRESSINGS) ×3 IMPLANT
ELECT REM PT RETURN 9FT ADLT (ELECTROSURGICAL) ×3
ELECTRODE REM PT RTRN 9FT ADLT (ELECTROSURGICAL) ×1 IMPLANT
GLOVE BIOGEL PI IND STRL 7.0 (GLOVE) ×2 IMPLANT
GLOVE BIOGEL PI IND STRL 7.5 (GLOVE) ×2 IMPLANT
GLOVE BIOGEL PI INDICATOR 7.0 (GLOVE) ×4
GLOVE BIOGEL PI INDICATOR 7.5 (GLOVE) ×4
GLOVE ECLIPSE 7.5 STRL STRAW (GLOVE) ×3 IMPLANT
GOWN STRL REUS W/TWL LRG LVL3 (GOWN DISPOSABLE) ×9 IMPLANT
HEMOSTAT ARISTA ABSORB 3G PWDR (MISCELLANEOUS) ×3 IMPLANT
KIT ABG SYR 3ML LUER SLIP (SYRINGE) ×3 IMPLANT
NEEDLE HYPO 25X5/8 SAFETYGLIDE (NEEDLE) ×3 IMPLANT
NS IRRIG 1000ML POUR BTL (IV SOLUTION) ×3 IMPLANT
PACK C SECTION WH (CUSTOM PROCEDURE TRAY) ×3 IMPLANT
PAD OB MATERNITY 4.3X12.25 (PERSONAL CARE ITEMS) ×3 IMPLANT
PENCIL SMOKE EVAC W/HOLSTER (ELECTROSURGICAL) ×3 IMPLANT
RTRCTR C-SECT PINK 25CM LRG (MISCELLANEOUS) ×3 IMPLANT
STRIP CLOSURE SKIN 1/2X4 (GAUZE/BANDAGES/DRESSINGS) ×2 IMPLANT
SUT PLAIN 0 NONE (SUTURE) ×6 IMPLANT
SUT VIC AB 0 CTX 36 (SUTURE) ×6
SUT VIC AB 0 CTX36XBRD ANBCTRL (SUTURE) ×3 IMPLANT
SUT VIC AB 2-0 CT1 (SUTURE) ×3 IMPLANT
SUT VIC AB 2-0 CT1 27 (SUTURE) ×2
SUT VIC AB 2-0 CT1 TAPERPNT 27 (SUTURE) ×1 IMPLANT
SUT VIC AB 4-0 KS 27 (SUTURE) ×3 IMPLANT
TOWEL OR 17X24 6PK STRL BLUE (TOWEL DISPOSABLE) ×3 IMPLANT
TRAY FOLEY BAG SILVER LF 14FR (SET/KITS/TRAYS/PACK) ×3 IMPLANT

## 2017-12-13 NOTE — Progress Notes (Signed)
Patient Name: Michelle Riddle, female   DOB: 1986-08-09, 32 y.o.  MRN: [id number]  Patient pushed for 1.5 hrs. At +2 station. Attempted vacuum assisted vaginal delivery due to maternal fatigue after discussing risks vs benefits and alternative of cesarean delivery. Discussed risks of hematoma, intracranial bleed, failure. 4 pulls without much progression. Stopped attempt and discussed proceeding with cesarean delivery.  The risks of cesarean section discussed with the patient included but were not limited to: bleeding which may require transfusion or reoperation; infection which may require antibiotics; injury to bowel, bladder, ureters or other surrounding organs; injury to the fetus; need for additional procedures including hysterectomy in the event of a life-threatening hemorrhage; placental abnormalities wth subsequent pregnancies, incisional problems, thromboembolic phenomenon and other postoperative/anesthesia complications. The patient concurred with the proposed plan, giving informed written consent for the procedure.   Patient has been NPO since last night, she will remain NPO for procedure. Anesthesia and OR aware.  Preoperative prophylactic Ancef ordered on call to the OR.  To OR when ready.  Levie HeritageStinson, Georges Victorio J, DO 12/13/2017 1:22 PM

## 2017-12-13 NOTE — Progress Notes (Signed)
Labor Progress Note Michelle RosebushKrystal Riddle is a 32 y.o. G2P1001 at 6154w6d presented for SROM.  S: Patient with much pain during contractions, requesting epidural  O:  BP (!) 122/45   Pulse 91   Temp 98.8 F (37.1 C) (Oral)   Resp 16   Ht 5\' 4"  (1.626 m)   Wt 188 lb 12 oz (85.6 kg)   LMP 01/28/2017   BMI 32.40 kg/m   WUJ:WJXBJYNWFHT:baseline rate 120, moderate variability, no acels, no decels Toco: ctx every 1-2 min  CVE: Dilation: 2.5 Effacement (%): 80 Station: -1 Presentation: Vertex Exam by:: Dr. Linwood Dibblesumball  A&P: 32 y.o. G2P1001 2954w6d here for SROM. Labor: Progressing, continue to augment with Pitocin, currently at 186ml/hr. Pain: requesting epidural FWB: Cat I  Michelle DenseAlison Schae Cando, DO 7:29 AM

## 2017-12-13 NOTE — Anesthesia Preprocedure Evaluation (Signed)
Anesthesia Evaluation  Patient identified by MRN, date of birth, ID band Patient awake    Reviewed: Allergy & Precautions, NPO status , Patient's Chart, lab work & pertinent test results  Airway Mallampati: II  TM Distance: >3 FB Neck ROM: Full    Dental no notable dental hx.    Pulmonary neg pulmonary ROS, Current Smoker,    Pulmonary exam normal breath sounds clear to auscultation       Cardiovascular negative cardio ROS Normal cardiovascular exam Rhythm:Regular Rate:Normal     Neuro/Psych negative neurological ROS  negative psych ROS   GI/Hepatic negative GI ROS, Neg liver ROS,   Endo/Other  negative endocrine ROS  Renal/GU negative Renal ROS  negative genitourinary   Musculoskeletal negative musculoskeletal ROS (+)   Abdominal   Peds negative pediatric ROS (+)  Hematology negative hematology ROS (+)   Anesthesia Other Findings   Reproductive/Obstetrics negative OB ROS (+) Pregnancy                             Anesthesia Physical Anesthesia Plan  ASA: II  Anesthesia Plan: Epidural   Post-op Pain Management:    Induction:   PONV Risk Score and Plan:   Airway Management Planned:   Additional Equipment:   Intra-op Plan:   Post-operative Plan:   Informed Consent:   Plan Discussed with:   Anesthesia Plan Comments:         Anesthesia Quick Evaluation  

## 2017-12-13 NOTE — Progress Notes (Addendum)
Michelle Riddle is a 32 y.o. G2P1001 at [redacted]w[redacted]d admitted for SROM.   Subjective: She states that she is still experiencing diffuse anterior pain that she cannot localize to one area. Admits that pain is better since receiving the epidural but she is still very uncomfortable. Patient is laying on her side in the bed.   Objective: BP 134/82   Pulse 80   Temp 98.1 F (36.7 C) (Oral)   Resp 18   Ht 5\' 4"  (1.626 m)   Wt 85.6 kg (188 lb 12 oz)   LMP 01/28/2017   SpO2 99%   BMI 32.40 kg/m  No intake/output data recorded. No intake/output data recorded.  FHT:  FHR: 120 bpm, variability:minimal,  accelerations:  Present,  decelerations:  Absent UC:   regular, every 4-5 minutes SVE:   Dilation: 7 Effacement (%): 100 Station: -1 Exam by:: Leanna Sato RN   Labs: Lab Results  Component Value Date   WBC 9.7 12/13/2017   HGB 11.1 (L) 12/13/2017   HCT 32.9 (L) 12/13/2017   MCV 80.6 12/13/2017   PLT 255 12/13/2017    Assessment / Plan: Spontaneous labor, progressing normally  Labor: Progressing normally and on Cyotec Fetal Wellbeing:  Category I Pain Control:  Epidural I/D:  n/a Anticipated MOD:  NSVD  Shirlee Limerick, PA-S 12/13/2017, 9:26 AM    CNM attestation:  I have seen and examined this patient and agree with above documentation in the PA students note.   Michelle Riddle is a 32 y.o. G2P1001 at [redacted]w[redacted]d  Here for SROm at 2300 on 12-12-2017 +FM, feeling contractions, now complete and plus 1.   PE: Patient Vitals for the past 24 hrs:  BP Temp Temp src Pulse Resp SpO2 Height Weight  12/13/17 1030 120/80 - - (!) 112 18 - - -  12/13/17 1010 - 98.2 F (36.8 C) Oral - - - - -  12/13/17 1000 114/74 - - 67 18 - - -  12/13/17 0930 103/67 - - 83 18 - - -  12/13/17 0900 109/60 - - 72 18 - - -  12/13/17 0830 134/82 - - 80 - 99 % - -  12/13/17 0825 134/84 - - 72 - 98 % - -  12/13/17 0820 125/88 - - 76 18 99 % - -  12/13/17 0815 129/85 - - 74 18 97 % - -  12/13/17 0810 (!) 142/84 - - 71 20 96 %  - -  12/13/17 0805 126/81 - - 74 20 99 % - -  12/13/17 0800 133/77 - - 75 20 99 % - -  12/13/17 0739 131/79 98.1 F (36.7 C) Oral 74 18 - - -  12/13/17 0702 (!) 122/45 - - 91 16 - - -  12/13/17 0700 (!) 152/102 - - 81 16 - - -  12/13/17 0631 134/81 - - 73 16 - - -  12/13/17 0601 117/74 98.8 F (37.1 C) Oral 72 16 - - -  12/13/17 0530 (!) 97/59 - - 76 16 - - -  12/13/17 0500 102/61 - - 68 16 - - -  12/13/17 0430 125/76 98.4 F (36.9 C) Oral 78 16 - - -  12/13/17 0344 - - - - - - 5\' 4"  (1.626 m) 188 lb 12 oz (85.6 kg)  12/13/17 0334 130/87 - - 80 - - - -  12/13/17 0333 130/87 99 F (37.2 C) Oral 80 16 - - -  12/13/17 0241 123/86 97.8 F (36.6 C) Oral 78 20 - - -  12/13/17 0235 - - - - - - 5' 4.5" (1.638 m) 188 lb 12 oz (85.6 kg)   Gen: calm comfortable, NAD Resp: normal effort, no distress Heart: Regular rate Abd: Soft, NT, gravid, S=D  FHR: Baseline 125, mod variability, pos accels, late decels that resolved with repositioning.  Toco: UC's Q 2-3 min,   ROS, labs, PMH reviewed  Orders Placed This Encounter  Procedures  . CBC  . RPR  . Diet clear liquid Room service appropriate? Yes; Fluid consistency: Thin  . Discontinue Pitocin if tachysystole with non-reassuring FHR is present  . Evaluate fetal heart rate to establish reassuring pattern prior to initiating Cytotec or Pitocin  . If tachysystole WITH reassuring FHR present notify MD / CNM  . Initiate intrauterine resuscitation if tachysystole with non-reasuring FHR is present  . Labor Induction  . May administer Terbutaline 0.25 mg SQ x 1 dose if tachysystole with non-reassuring FHR is presesnt  . Nofify MD/CNM if tachysystole with non-reassuring FHR is present  . Perform a cervical exam prior to initiating Cytotec or Pitocin  . Patient may have epidural upon request  . Activity as tolerated  . Fetal monitoring per unit policy  . Notify Physician  . Vital signs  . Cervical Exam  . Discontinue foley prior to vaginal  delivery  . Fundal check post delivery every 15 min x 1 hour then every 30 min x 1 hour  . If Rapid HIV test positive or known HIV positive: initiate AZT orders  . Insert foley catheter  . May in and out cath x 2 for inability to void  . Measure blood pressure post delivery every 15 min x 1 hour then every 30 min x 1 hour  . Labor Augmentation  . May use local infiltration of 1% lidocaine (plain) to produce a skin wheal prior to insertion of IV catheter  . Notify in-house Anesthesia team of nausea and vomiting greater than 5 hours  . Assess for signs/symptoms of PIH/preeclampsia  . Place lab order for CBC if one has not been drawn in the past 6 hours for all patients with hypertensive disease, pre-eclampsia, eclampsia, thrombocytopenia or previous PLTC<150,000.  . Identify to Anesthesia if patient plans to have postpartum tubal ligation; do not remove epidural without discussion with Anesthesiologist  . Following Epidural Placement, re-bolus or re-dose monitor patient's BP and oxygen saturation every 5 minutes for 30 minutes  . RN to remain at bedside continuously for 30 minutes post epidural placement, post re-bolus / re-dose  . Call Anesthesiologist if the patient becomes short of breath or complains of heaviness in chest, chest pain, and/or unrelieved pain  . Call Anesthesiologist if the epidural infusion is discontinued at any time  . May use local infiltration of 1% lidocaine (plain) to produce a skin wheal prior to insertion of IV catheter  . Notify in-house Anesthesia team of nausea and vomiting greater than 5 hours  . Assess for signs/symptoms of PIH/preeclampsia  . Place lab order for CBC if one has not been drawn in the past 6 hours for all patients with hypertensive disease, pre-eclampsia, eclampsia, thrombocytopenia or previous PLTC<150,000.  . Identify to Anesthesia if patient plans to have postpartum tubal ligation; do not remove epidural without discussion with Anesthesiologist  .  Following Epidural Placement, re-bolus or re-dose monitor patient's BP and oxygen saturation every 5 minutes for 30 minutes  . RN to remain at bedside continuously for 30 minutes post epidural placement, post re-bolus / re-dose  .  Call Anesthesiologist if the patient becomes short of breath or complains of heaviness in chest, chest pain, and/or unrelieved pain  . Call Anesthesiologist if the epidural infusion is discontinued at any time  . Full code  . Oxygen therapy  . [facility]  . Insert and maintain IV Line  . Admit to Inpatient (patient's expected length of stay will be greater than 2 midnights or inpatient only procedure)   Meds ordered this encounter  Medications  . DISCONTD: misoprostol (CYTOTEC) tablet 25 mcg  . terbutaline (BRETHINE) injection 0.25 mg  . fentaNYL (SUBLIMAZE) injection 100 mcg  . lactated ringers infusion  . lactated ringers infusion 500-1,000 mL  . oxytocin (PITOCIN) IV BOLUS FROM BAG  . oxytocin (PITOCIN) IV infusion 40 units in LR 1000 mL - Premix  . acetaminophen (TYLENOL) tablet 650 mg  . lidocaine (PF) (XYLOCAINE) 1 % injection 30 mL  . ondansetron (ZOFRAN) injection 4 mg  . oxyCODONE-acetaminophen (PERCOCET/ROXICET) 5-325 MG per tablet 1 tablet  . oxyCODONE-acetaminophen (PERCOCET/ROXICET) 5-325 MG per tablet 2 tablet  . sodium citrate-citric acid (ORACIT) solution 30 mL  . sodium phosphate (FLEET) 7-19 GM/118ML enema 1 enema  . oxytocin (PITOCIN) IV infusion 40 units in LR 1000 mL - Premix    Order Specific Question:   Begin infusion at:    Answer:   2 milli-units/min (3 mL/hr)    Order Specific Question:   Increase infusion by:    Answer:   2 milli-units/min (3 mL/hr)  . ePHEDrine injection 10 mg  . PHENYLephrine 40 mcg/ml in normal saline Adult IV Push Syringe  . lactated ringers infusion 500 mL  . fentaNYL 2.5 mcg/ml w/bupivacaine 0.1% in NS 100ml epidural infusion (WH-ANES)  . diphenhydrAMINE (BENADRYL)  injection 12.5 mg  . ePHEDrine injection 10 mg  . PHENYLephrine 40 mcg/ml in normal saline Adult IV Push Syringe  . DISCONTD: ePHEDrine injection 10 mg  . DISCONTD: PHENYLephrine 40 mcg/ml in normal saline Adult IV Push Syringe  . lactated ringers infusion 500 mL  . DISCONTD: fentaNYL 2.5 mcg/ml w/bupivacaine 0.1% in NS 100ml epidural infusion (WH-ANES)  . DISCONTD: diphenhydrAMINE (BENADRYL) injection 12.5 mg  . DISCONTD: ePHEDrine injection 10 mg  . DISCONTD: PHENYLephrine 40 mcg/ml in normal saline Adult IV Push Syringe    Assessment: Plan: 1. Will labor down on peanut ball; RN at the bedside to assess. 2. Continue expectant management.   Marylene LandKooistra, Sahvannah Rieser Lorraine, CNM 12/13/2017 10:47 AM

## 2017-12-13 NOTE — Progress Notes (Signed)
NST performed today was reviewed and was found to be reactive.  Continue with IOL as planned for postterm pregnancy.  Jaynie CollinsUgonna Boyd Buffalo, MD

## 2017-12-13 NOTE — Transfer of Care (Signed)
Immediate Anesthesia Transfer of Care Note  Patient: Michelle Riddle  Procedure(s) Performed: CESAREAN SECTION (N/A Abdomen)  Patient Location: PACU  Anesthesia Type:Epidural  Level of Consciousness: awake, alert  and oriented  Airway & Oxygen Therapy: Patient Spontanous Breathing  Post-op Assessment: Report given to RN and Post -op Vital signs reviewed and stable  Post vital signs: Reviewed and stable  Last Vitals:  Vitals Value Taken Time  BP    Temp    Pulse    Resp    SpO2      Last Pain:  Vitals:   12/13/17 1200  TempSrc: Oral  PainSc:          Complications: No apparent anesthesia complications

## 2017-12-13 NOTE — Anesthesia Pain Management Evaluation Note (Signed)
  CRNA Pain Management Visit Note  Patient: Michelle Riddle, 32 y.o., female  "Hello [facility] am a [facility]. We have an anesthesia team available at all times to provide care throughout the hospital, including epidural management and anesthesia for C-section. I don't know your plan for the delivery whether it a natural birth, water birth, IV sedation, nitrous supplementation, doula or epidural, but we want to meet your pain goals."   1.Was your pain managed to your expectations on prior hospitalizations?   Yes   2.What is your expectation for pain management during this hospitalization?     Epidural  3.How can we help you reach that goal? Epidural being placed now.  Record the patient's initial score and the patient's pain goal.   Pain: 8  Pain Goal: 5 [facility] wants you to be able to say your pain was always managed very well.  Desta Bujak 12/13/2017

## 2017-12-13 NOTE — Op Note (Signed)
Michelle RosebushKrystal Stalder PROCEDURE DATE: 12/13/2017  PREOPERATIVE DIAGNOSIS: Intrauterine pregnancy at  2823w6d weeks gestation; failure to progress: arrest of descent and failed vacuum, undesired fertility  POSTOPERATIVE DIAGNOSIS: The same, asynclitic position of baby  PROCEDURE: Primary Low Transverse Cesarean Section  SURGEON:  Dr. Candelaria CelesteJacob Stinson  ASSISTANT:   INDICATIONS: Michelle Riddle is a 32 y.o. G2P1001 at 1623w6d scheduled for cesarean section secondary to failure to progress: arrest of descent and failed vacuum.  The risks of cesarean section discussed with the patient included but were not limited to: bleeding which may require transfusion or reoperation; infection which may require antibiotics; injury to bowel, bladder, ureters or other surrounding organs; injury to the fetus; need for additional procedures including hysterectomy in the event of a life-threatening hemorrhage; placental abnormalities wth subsequent pregnancies, incisional problems, thromboembolic phenomenon and other postoperative/anesthesia complications. The patient concurred with the proposed plan, giving informed written consent for the procedure.    FINDINGS:  Viable female infant in vertex, asynclitic presentation.  Apgars 4 and 9, weight pending. Clear amniotic fluid.  Intact placenta, three vessel cord.  Normal uterus, fallopian tubes and ovaries bilaterally.  ANESTHESIA:    Spinal INTRAVENOUS FLUIDS:3000 ml ESTIMATED BLOOD LOSS: 1291 ml URINE OUTPUT:  150 ml SPECIMENS: Placenta sent to L&D COMPLICATIONS: None immediate  PROCEDURE IN DETAIL:  The patient received intravenous antibiotics and had sequential compression devices applied to her lower extremities while in the preoperative area.  She was then taken to the operating room where epidural anesthesia was dosed up to surgical level and was found to be adequate. She was then placed in a dorsal supine position with a leftward tilt, and prepped and draped in a sterile manner.  A  foley catheter was placed into her bladder and attached to constant gravity, which drained hematuria initially, but cleared as the case went on.  After an adequate timeout was performed, a Pfannenstiel skin incision was made with scalpel and carried through to the underlying layer of fascia. The fascia was incised in the midline and this incision was extended bilaterally using the Mayo scissors. Kocher clamps were applied to the superior aspect of the fascial incision and the underlying rectus muscles were dissected off bluntly. A similar process was carried out on the inferior aspect of the facial incision. The rectus muscles were separated in the midline bluntly and the peritoneum was entered bluntly. An Alexis retractor was placed to aid in visualization of the uterus.  Attention was turned to the lower uterine segment where a transverse hysterotomy was made with a scalpel and extended bilaterally bluntly. The infant was successfully delivered, and cord was clamped and cut and infant was handed over to awaiting neonatology team. Uterine massage was then administered and the placenta delivered intact with three-vessel cord. The uterus was then cleared of clot and debris.  The hysterotomy was inspected, revealing an extension left corner down the lower uterine segment. The hysterotomy and extension were closed with 0 Vicryl in a running locked fashion, and an imbricating layer was also placed with a 0 Vicryl. Overall, excellent hemostasis was noted.   The patient's left fallopian tube was then identified and grasped with a Babcock clamp. The tube was then followed out to the fimbria. The Babcock clamp was then used to grasp the tube approximately 4 cm from the cornual region. A 3 cm segment of the tube was then ligated with free tie of plain gut suture, transected and excised. Good hemostasis was noted and the tube was returned  to the abdomen. The right fallopian tube was then identified to its fimbriated end,  ligated, and a 3 cm segment excised in a similar fashion. Excellent hemostasis was noted, and the tube returned to the abdomen.  The abdomen and the pelvis were cleared of all clot and debris and the Jon Gills was removed. Hemostasis was confirmed on all surfaces.  The peritoneum was reapproximated using 2-0 vicryl running stitches. The fascia was then closed using 0 Vicryl in a running fashion. The subcutaneous layer was reapproximated with plain gut and the skin was closed with 4-0 vicryl. The patient tolerated the procedure well. Sponge, lap, instrument and needle counts were correct x 2. She was taken to the recovery room in stable condition.    Levie Heritage, DO 12/13/2017 2:48 PM

## 2017-12-13 NOTE — Anesthesia Postprocedure Evaluation (Signed)
Anesthesia Post Note  Patient: Michelle Riddle  Procedure(s) Performed: CESAREAN SECTION (N/A Abdomen)     Patient location during evaluation: Mother Baby Anesthesia Type: Epidural Level of consciousness: awake and alert Pain management: pain level controlled Vital Signs Assessment: post-procedure vital signs reviewed and stable Respiratory status: spontaneous breathing, nonlabored ventilation and respiratory function stable Cardiovascular status: stable Postop Assessment: no headache, no backache and epidural receding Anesthetic complications: no    Last Vitals:  Vitals:   12/13/17 1600 12/13/17 1618  BP: 110/64 113/71  Pulse: 75 78  Resp: 18 18  Temp:  37 C  SpO2: 99% 98%    Last Pain:  Vitals:   12/13/17 1555  TempSrc:   PainSc: 3    Pain Goal:                 Lowella CurbWarren Ray Miller

## 2017-12-13 NOTE — Anesthesia Procedure Notes (Signed)
Epidural Patient location during procedure: OB Start time: 12/13/2017 7:54 AM End time: 12/13/2017 8:08 AM  Staffing Anesthesiologist: Lowella CurbMiller, Laneshia Pina Ray, MD Performed: anesthesiologist   Preanesthetic Checklist Completed: patient identified, site marked, surgical consent, pre-op evaluation, timeout performed, IV checked, risks and benefits discussed and monitors and equipment checked  Epidural Patient position: sitting Prep: ChloraPrep Patient monitoring: heart rate, cardiac monitor, continuous pulse ox and blood pressure Approach: midline Location: L2-L3 Injection technique: LOR saline  Needle:  Needle type: Tuohy  Needle gauge: 17 G Needle length: 9 cm Needle insertion depth: 6 cm Catheter type: closed end flexible Catheter size: 20 Guage Catheter at skin depth: 10 cm Test dose: negative  Assessment Events: blood not aspirated, injection not painful, no injection resistance, negative IV test and no paresthesia  Additional Notes Reason for block:procedure for pain

## 2017-12-13 NOTE — H&P (Addendum)
OBSTETRIC ADMISSION HISTORY AND PHYSICAL  Michelle RosebushKrystal Riddle is a 32 y.o. female G2P1001 with IUP at 5477w6d by 7wk US presenting for SROM around 11p with green colored fluid. She reports +FMs, no VB, no blurry vision, headaches or peripheral edema, and RUQ pain.  She plans on breast and bottle feeding. She request BTL for birth control (papers signed). [facility]   Dating: By 7 wk US --->  Estimated Date of Delivery: 12/07/17  Sono:   @[redacted]w[redacted]d , CWD, normal anatomy, cephalic presentation, AFI nl. Anterior placenta. @ 377w6d 281g, 52% EFW  Prenatal History/Complications: Post dates pregnancy Asymptomatic bacteriuria during 1st trimester, TOC neg   Past Medical History: No past medical history on file.  Past Surgical History: No past surgical history on file.  Obstetrical History: OB History    Gravida  2   Para  1   Term  1   Preterm  0   AB  0   Living  1     SAB  0   TAB  0   Ectopic  0   Multiple  0   Live Births  1          Social History: Social History   Socioeconomic History  . Marital status: Single    Spouse name: Not on file  . Number of children: Not on file  . Years of education: Not on file  . Highest education level: Not on file  Occupational History  . Not on file  Social Needs  . Financial resource strain: Not on file  . Food insecurity:    Worry: Not on file    Inability: Not on file  . Transportation needs:    Medical: Not on file    Non-medical: Not on file  Tobacco Use  . Smoking status: Current Every Day Smoker    Packs/day: 0.50    Years: 3.00    Pack years: 1.50    Types: Cigarettes  . Smokeless tobacco: Never Used  . Tobacco comment: weaning off/tn  Substance and Sexual Activity  . Alcohol use: Yes    Comment: rarely maybe once a month  . Drug use: No  . Sexual activity: Yes    Partners: Male    Birth control/protection: None  Lifestyle  . Physical activity:    Days per week: Not on file     Minutes per session: Not on file  . Stress: Not on file  Relationships  . Social connections:    Talks on phone: Not on file    Gets together: Not on file    Attends religious service: Not on file    Active member of club or organization: Not on file    Attends meetings of clubs or organizations: Not on file    Relationship status: Not on file  Other Topics Concern  . Not on file  Social History Narrative  . Not on file   Family History: Family History  Problem Relation Age of Onset  . Diabetes Maternal Grandmother    Allergies: No Known Allergies  Medications Prior to Admission  Medication Sig Dispense Refill Last Dose  . acetaminophen (TYLENOL) 500 MG tablet Take 500 mg by mouth every 6 (six) hours as needed.   Past Week at Unknown time  . Prenatal Vit-Fe Fumarate-FA (MULTIVITAMIN-PRENATAL) 27-0.8 MG TABS tablet Take 1 tablet by mouth daily at 12 noon.   Past Week at Unknown time  . cyclobenzaprine (FLEXERIL) 10 MG tablet  Take 1 tablet (10 mg total) by mouth every 8 (eight) hours as needed for muscle spasms. 15 tablet 1 Unknown at Unknown time  . Doxylamine-Pyridoxine ER (BONJESTA) 20-20 MG TBCR Take 20 mg by mouth 2 (two) times daily. 60 tablet 1 Unknown at Unknown time   Review of Systems   All systems reviewed and negative except as stated in HPI  Blood pressure 130/87, pulse 80, temperature 99 F (37.2 C), temperature source Oral, resp. rate 16, height 5\' 4"  (1.626 m), weight 188 lb 12 oz (85.6 kg), last menstrual period 01/28/2017. General appearance: alert, cooperative and no distress Lungs: clear to auscultation bilaterally Heart: regular rate and rhythm Abdomen: soft, non-tender; bowel sounds normal Extremities: Homans sign is negative, no sign of DVT Presentation: cephalic by bedside US Fetal monitoringBaseline: 120 bpm, Variability: Good {> 6 bpm), Accelerations: Reactive and Decelerations: Absent Uterine activity ctx every 4-5 mins Dilation: 1 Effacement (%):  60 Station: -1 Exam by:: Rumeall   Prenatal labs: ABO, Rh: O/Positive/-- (08/29 1645) Antibody: Negative (08/29 1645) Rubella: 3.76 (08/29 1645) RPR: Non Reactive (12/27 0835)  HBsAg: Negative (08/29 1645)  HIV: Non Reactive (12/27 0835)  GBS:   Negative 2 hr Glucola normal (75, 126, 63) Genetic screening  CF variant detected, T21 low probability Anatomy US @ [redacted]w[redacted]d, normal anatomy, female fetus  Prenatal Transfer Tool  Maternal Diabetes: No Genetic Screening: Abnormal:  Results: Other: CF variant detected Maternal Ultrasounds/Referrals: Normal Fetal Ultrasounds or other Referrals:  None Maternal Substance Abuse:  No Significant Maternal Medications:  None Significant Maternal Lab Results: Lab values include: Group B Strep negative  No results found for this or any previous visit (from the past 24 hour(s)).  Patient Active Problem List   Diagnosis Date Noted  . Post term pregnancy 12/13/2017  . Post-term pregnancy, 40-42 weeks of gestation 12/07/2017  . Pruritic folliculitis of pregnancy 07/13/2017  . Asymptomatic bacteriuria during pregnancy in first trimester 05/30/2017  . Supervision of other normal pregnancy, antepartum 04/20/2017  . Infertility, female 01/27/2017    Assessment/Plan:  Michelle Riddle is a 32 y.o. G2P1001 at [redacted]w[redacted]d here for SROM  #Labor: Augment with Pitocin, anticipate SVD. #Pain: Per patient request #FWB: Cat I #ID:  GBS negative #MOF: both #MOC:BTL (papers signed) #Circ:  Yes (in)  Ellwood Dense, DO  12/13/2017, 3:57 AM  OB FELLOW HISTORY AND PHYSICAL ATTESTATION  I confirm that I have verified the information documented in the resident's note and that I have also personally reperformed the physical exam and all medical decision making activities. I agree with above documentation and have made edits as needed.   Caryl Ada, DO OB Fellow

## 2017-12-14 ENCOUNTER — Encounter (HOSPITAL_COMMUNITY): Payer: Self-pay | Admitting: *Deleted

## 2017-12-14 ENCOUNTER — Inpatient Hospital Stay (HOSPITAL_COMMUNITY): Admission: RE | Admit: 2017-12-14 | Payer: Medicaid Other | Source: Ambulatory Visit

## 2017-12-14 LAB — CBC
HCT: 24.3 % — ABNORMAL LOW (ref 36.0–46.0)
Hemoglobin: 8.2 g/dL — ABNORMAL LOW (ref 12.0–15.0)
MCH: 27.3 pg (ref 26.0–34.0)
MCHC: 33.7 g/dL (ref 30.0–36.0)
MCV: 81 fL (ref 78.0–100.0)
PLATELETS: 246 10*3/uL (ref 150–400)
RBC: 3 MIL/uL — ABNORMAL LOW (ref 3.87–5.11)
RDW: 13.6 % (ref 11.5–15.5)
WBC: 11.4 10*3/uL — ABNORMAL HIGH (ref 4.0–10.5)

## 2017-12-14 LAB — CREATININE, SERUM
CREATININE: 0.6 mg/dL (ref 0.44–1.00)
GFR calc Af Amer: >60 mL/min (ref >60)

## 2017-12-14 MED ORDER — COMPLETENATE 29-1 MG PO CHEW
1.0000 | CHEWABLE_TABLET | Freq: Every day | ORAL | Status: DC
  Filled 2017-12-14 (×3): qty 1

## 2017-12-14 MED ORDER — IBUPROFEN 100 MG/5ML PO SUSP
600.0000 mg | Freq: Four times a day (QID) | ORAL | Status: DC
  Administered 2017-12-14 – 2017-12-15 (×5): 600 mg via ORAL
  Filled 2017-12-14 (×9): qty 30

## 2017-12-14 MED ORDER — FERROUS SULFATE 325 (65 FE) MG PO TABS
325.0000 mg | ORAL_TABLET | Freq: Two times a day (BID) | ORAL | Status: DC
  Administered 2017-12-14 – 2017-12-15 (×2): 325 mg via ORAL
  Filled 2017-12-14 (×2): qty 1

## 2017-12-14 NOTE — Progress Notes (Signed)
Foley DC'd without difficulty. Mom told to call for help when she needed to void.

## 2017-12-14 NOTE — Anesthesia Postprocedure Evaluation (Signed)
Anesthesia Post Note  Patient: Michelle RosebushKrystal Riddle  Procedure(s) Performed: CESAREAN SECTION (N/A Abdomen)     Patient location during evaluation: Mother Baby Anesthesia Type: Epidural Level of consciousness: awake Pain management: pain level controlled Vital Signs Assessment: post-procedure vital signs reviewed and stable Respiratory status: spontaneous breathing Cardiovascular status: stable Postop Assessment: no headache, no backache, epidural receding, patient able to bend at knees, no apparent nausea or vomiting and adequate PO intake Anesthetic complications: no    Last Vitals:  Vitals:   12/14/17 0430 12/14/17 0539  BP: (!) 105/58   Pulse: 69   Resp: 16   Temp: 36.9 C   SpO2: 95% 96%    Last Pain:  Vitals:   12/14/17 0430  TempSrc: Oral  PainSc:    Pain Goal: Patients Stated Pain Goal: 3 (12/13/17 1618)               Fanny DanceMULLINS,Candies Palm

## 2017-12-14 NOTE — Progress Notes (Signed)
Patient requested foley to be in for a bit longer. I explained infections risk factors.

## 2017-12-14 NOTE — Progress Notes (Signed)
Patient states she is not dizzy anymore just tired. Foley still in due to patient getting dizzy on nights but now is resolved. Patient told about doing her incentive spirometer. Patient did it once already. Plan of care and pain regimen explained. Patient told she needs to walk hallways today. Negative flatus per patient. Patient tired.

## 2017-12-14 NOTE — Progress Notes (Signed)
Patient given all VIS sheets. I tried to give her a flu shot and she wanted to wait and read over information.  I told her to let RN know when she wanted her shots.

## 2017-12-14 NOTE — Addendum Note (Signed)
Addendum  created 12/14/17 0741 by Renford DillsMullins, Masen Luallen L, CRNA   Sign clinical note

## 2017-12-14 NOTE — Progress Notes (Signed)
POSTPARTUM PROGRESS NOTE  Post Partum Day 1 Subjective:  Michelle Riddle is a 32 y.o. [id number] 3398w6d s/p stat pLTCS/BTL after failure to progress and failed vacuum attempt.  No acute events overnight.  Patient denies problems with po intake. Pt reports problems with ambulating. She states that when the nurse attempted to get her out of bed to walk around she became dizzy and needed to sit back down. She denies symptoms while sitting or laying down. Since she is unable to ambulate at this time, the urinary catheter was kept in place for now. She denies nausea or vomiting.  Pain is well controlled. She reports minimal pain only when the incision site is palpated. Due to inability to ambulate, she is unsure of the quantity of vaginal bleeding or clots.  Patient still desired a circumcision for the baby in the hospital. She is now considering exclusively bottle feeding. Previously she was anticipating bottle and breast feeding. She received BTL yesterday for postpartum birth control.   Objective: Blood pressure (!) 105/58, pulse 69, temperature 98.4 F (36.9 C), temperature source Oral, resp. rate 16, height 5\' 4"  (1.626 m), weight 85.6 kg (188 lb 12 oz), last menstrual period 01/28/2017, SpO2 96 %, unknown if currently breastfeeding.  Physical Exam:  General: alert, cooperative and no distress Lochia:normal flow Chest: no respiratory distress Heart:regular rate, distal pulses intact Abdomen: soft, nontender. Incision site is dry, clean, with minimal amount of blood on the honeycomb dressing.  Uterine Fundus: firm, appropriately tender DVT Evaluation: No calf swelling or tenderness Extremities: no edema  Recent Labs    12/13/17 0302 12/14/17 0556  HGB 11.1* 8.2*  HCT 32.9* 24.3*    Assessment/Plan:  ASSESSMENT: Michelle Riddle is a 32 y.o. [id number] 7398w6d s/p stat pLTCS/BTL after failure to progress and failed vacuum attempt.   Plan for discharge tomorrow, Circumcision prior to discharge and  Contraception BTL (performed yesterday)   LOS: 1 day   [facility]ek[facility] Jaiceon Collister , PA-S 12/14/2017, 7:27 AM

## 2017-12-14 NOTE — Progress Notes (Signed)
Assisted patientt in stand by assist. Patient was able to stand up at the side of the bed, stated "my legs feel wobbly. I cant walk". Patient back in bed at this time. Catheter was left in due to patient not being able to ambulate to restroom yet.

## 2017-12-15 MED ORDER — OXYCODONE-ACETAMINOPHEN 5-325 MG PO TABS: 1.0000 | ORAL_TABLET | Freq: Four times a day (QID) | ORAL | 0 refills | Status: DC | PRN

## 2017-12-15 MED ORDER — IBUPROFEN 100 MG/5ML PO SUSP: 600.0000 mg | Freq: Four times a day (QID) | ORAL | 0 refills | Status: DC

## 2017-12-15 NOTE — Progress Notes (Signed)
Attending Circumcision Counseling Progress Note  Patient desires circumcision for her female infant.  Circumcision procedure details discussed, risks and benefits of procedure were also discussed.  These include but are not limited to: Benefits of circumcision in men include reduction in the rates of urinary tract infection (UTI), penile cancer, some sexually transmitted infections, penile inflammatory and retractile disorders, as well as easier hygiene.  Risks include bleeding , infection, injury of glans which may lead to penile deformity or urinary tract issues, unsatisfactory cosmetic appearance and other potential complications related to the procedure.  It was emphasized that this is an elective procedure.  Patient wants to proceed with circumcision; written informed consent obtained.  Will do circumcision soon, routine circumcision and post circumcision care ordered for the infant.  Michelle CollinsUGONNA  Riddle, M.D. 12/15/2017 9:28 AM

## 2017-12-15 NOTE — Discharge Summary (Signed)
OB Discharge Summary     Patient Name: Michelle Riddle DOB: 1986/03/23 MRN: [id number]  Date of admission: 12/13/2017 Delivering MD: Levie HeritageSTINSON, JACOB J   Date of discharge: 12/15/2017  Admitting diagnosis: 40.6 WEEKS CTX Intrauterine pregnancy: 5658w6d     Secondary diagnosis:  Active Problems:   Post term pregnancy  Additional problems: Failure to descend, Failed vacuum, undesired fertility, asynclitic position of baby     Discharge diagnosis: Term Pregnancy Delivered                                                                                                Post partum procedures:BTL done with Cesarean Delivery  Augmentation: AROM, Pitocin and Cytotec  Complications: None  Hospital course:  Onset of Labor With Unplanned C/S  32 y.o. yo [id number] at 5458w6d was admitted in Latent Labor on 12/13/2017. Patient had a labor course significant for SROM with augmented labor.  She progressed to complete dilation  And vacuum delivery was attempted and was unsuccessful.  They proceeded with Cesarean delivery and BTL.. Membrane Rupture Time/Date: 11:00 PM ,12/12/2017   The patient went for cesarean section due to Arrest of Descent, and delivered a Viable infant,12/13/2017  Details of operation can be found in separate operative note. Patient had an uncomplicated postpartum course.  She is ambulating,tolerating a regular diet, passing flatus, and urinating well.  Patient is discharged home in stable condition 12/15/17.  Physical exam  Vitals:   12/14/17 1146 12/14/17 1436 12/14/17 1850 12/15/17 0626  BP: 112/68  103/60 119/74  Pulse: 85  88 76  Resp: 18  18 18   Temp: 98.2 F (36.8 C)  98.4 F (36.9 C) 98.5 F (36.9 C)  TempSrc:   Oral Oral  SpO2: 99%     Weight:  188 lb 11.4 oz (85.6 kg)    Height:  5\' 4"  (1.626 m)     General: alert, cooperative and no distress Lochia: appropriate Uterine Fundus: firm Incision: Healing well with no significant drainage, No significant erythema DVT  Evaluation: No evidence of DVT seen on physical exam. Labs: Lab Results  Component Value Date   WBC 11.4 (H) 12/14/2017   HGB 8.2 (L) 12/14/2017   HCT 24.3 (L) 12/14/2017   MCV 81.0 12/14/2017   PLT 246 12/14/2017   CMP Latest Ref Rng & Units 12/14/2017  Glucose 65 - 99 mg/dL -  BUN 6 - 20 mg/dL -  Creatinine 1.470.44 - 8.291.00 mg/dL 5.620.60  Sodium 130134 - 865144 mmol/L -  Potassium 3.5 - 5.2 mmol/L -  Chloride 96 - 106 mmol/L -  CO2 20 - 29 mmol/L -  Calcium 8.7 - 10.2 mg/dL -  Total Protein 6.0 - 8.5 g/dL -  Total Bilirubin 0.0 - 1.2 mg/dL -  Alkaline Phos 39 - 784117 IU/L -  AST 0 - 40 IU/L -  ALT 0 - 32 IU/L -    Discharge instruction: per After Visit Summary and "Baby and Me Booklet".  After visit meds:  Allergies as of 12/15/2017   No Known Allergies     Medication List    STOP  taking these medications   acetaminophen 500 MG tablet Commonly known as:  TYLENOL   cyclobenzaprine 10 MG tablet Commonly known as:  FLEXERIL   Doxylamine-Pyridoxine ER 20-20 MG Tbcr Commonly known as:  BONJESTA     TAKE these medications   ibuprofen 100 MG/5ML suspension Commonly known as:  ADVIL,MOTRIN Take 30 mLs (600 mg total) by mouth every 6 (six) hours.   multivitamin-prenatal 27-0.8 MG Tabs tablet Take 1 tablet by mouth daily at 12 noon.   oxyCODONE-acetaminophen 5-325 MG tablet Commonly known as:  PERCOCET/ROXICET Take 1 tablet by mouth every 6 (six) hours as needed for moderate pain (pain scale 4-7).       Diet: routine diet  Activity: Advance as tolerated. Pelvic rest for 6 weeks.   Outpatient follow up:2 weeks Follow up Appt: Future Appointments  Date Time Provider Department Center  01/18/2018 11:00 AM Federico Flake, MD [facility]   Follow up Visit:No follow-ups on file.  Postpartum contraception: Tubal Ligation  Newborn Data: Live born female  Birth Weight: 7 lb 12.7 oz (3535 g) APGAR: 4, 9  Newborn Delivery   Birth date/time:  12/13/2017  13:44:00 Delivery type:  C-Section, Low Transverse C-section categorization:  Primary     Baby Feeding: Breast Disposition:home with mother   12/15/2017 Wynelle Bourgeois, CNM

## 2017-12-15 NOTE — Discharge Instructions (Signed)

## 2017-12-19 ENCOUNTER — Ambulatory Visit: Payer: Medicaid Other | Admitting: Family Medicine

## 2017-12-20 ENCOUNTER — Encounter: Payer: Self-pay | Admitting: Obstetrics and Gynecology

## 2017-12-20 ENCOUNTER — Ambulatory Visit (INDEPENDENT_AMBULATORY_CARE_PROVIDER_SITE_OTHER): Payer: Medicaid Other | Admitting: Obstetrics and Gynecology

## 2017-12-20 VITALS — BP 124/71 | HR 72 | Wt 179.1 lb

## 2017-12-20 DIAGNOSIS — K5903 Drug induced constipation: Secondary | ICD-10-CM

## 2017-12-20 DIAGNOSIS — R600 Localized edema: Secondary | ICD-10-CM

## 2017-12-20 MED ORDER — POLYETHYLENE GLYCOL 3350 17 G PO PACK: 17.0000 g | PACK | Freq: Two times a day (BID) | ORAL | 0 refills | Status: DC

## 2017-12-20 MED ORDER — HYDROCHLOROTHIAZIDE 25 MG PO TABS: 25.0000 mg | ORAL_TABLET | Freq: Every day | ORAL | 0 refills | Status: DC

## 2017-12-20 NOTE — Progress Notes (Signed)
Obstetrics Visit Incision Check Visit  Appointment Date: 12/20/2017  OB[facility] Clinic[facility]  Primary Care Provider: Patient, No Pcp Per  Chief Complaint:  Chief Complaint  Patient presents with  . Wound Check  . Foot Swelling    History of Present Illness: Michelle Riddle is a 32 y.o. Caucasian G2P2002 (No LMP recorded.), seen for the above chief complaint. Her past medical history is significant for nothing   She is s/p pLTCS and BTL on 3/26 for arrest of descent after failed vacuum; she was discharged to home on POD#2  Last BM about a week ago and b/l LE edema that seems to be getting worse. Pt is formula feeding.   Review of Systems:  as noted in the History of Present Illness.  Medications Michelle RosebushKrystal Waid had no medications administered during this visit. Current Outpatient Medications  Medication Sig Dispense Refill  . ibuprofen (ADVIL,MOTRIN) 100 MG/5ML suspension Take 30 mLs (600 mg total) by mouth every 6 (six) hours. 473 mL 0  . oxyCODONE-acetaminophen (PERCOCET/ROXICET) 5-325 MG tablet Take 1 tablet by mouth every 6 (six) hours as needed for moderate pain (pain scale 4-7). 30 tablet 0  . Prenatal Vit-Fe Fumarate-FA (MULTIVITAMIN-PRENATAL) 27-0.8 MG TABS tablet Take 1 tablet by mouth daily at 12 noon.    . hydrochlorothiazide (HYDRODIURIL) 25 MG tablet Take 1 tablet (25 mg total) by mouth daily. 14 tablet 0  . polyethylene glycol (MIRALAX / GLYCOLAX) packet Take 17 g by mouth 2 (two) times daily. 100 each 0   No current facility-administered medications for this visit.     Allergies Patient has no known allergies.  Physical Exam:  BP 124/71   Pulse 72   Wt 179 lb 1.6 oz (81.2 kg)   BMI 30.74 kg/m  Body mass index is 30.74 kg/m. General appearance: Well nourished, well developed female in no acute distress.  Respiratory:  Normal respiratory effort Abdomen: soft, nttp, nd. Well healed c/d/i with steri strips in place Neuro/Psych:  Normal mood and  affect.  Skin:  Warm and dry.  Ext: 2 to 3+ b/l LE edema, nttp, no erythema  Laboratory: none  Assessment: pt doing well  Plan:  Told pt to remove steri strips tonight after shower Pt amenable to 2wk course of hctz. Will check electrolytes today miralax for constipation.  RTC 3wks for regular PP visit.   Cornelia Copaharlie Hyatt Capobianco, Jr MD Attending [facility](Faculty Practice)

## 2017-12-21 LAB — BASIC METABOLIC PANEL
BUN/Creatinine Ratio: 14 (ref 9–23)
BUN: 9 mg/dL (ref 6–20)
CHLORIDE: 102 mmol/L (ref 96–106)
CO2: 23 mmol/L (ref 20–29)
CREATININE: 0.66 mg/dL (ref 0.57–1.00)
Calcium: 8.3 mg/dL — ABNORMAL LOW (ref 8.7–10.2)
GFR calc Af Amer: 136 mL/min/{1.73_m2} (ref >59)
GFR calc non Af Amer: 118 mL/min/{1.73_m2} (ref >59)
GLUCOSE: 74 mg/dL (ref 65–99)
Potassium: 4.4 mmol/L (ref 3.5–5.2)
SODIUM: 141 mmol/L (ref 134–144)

## 2017-12-21 LAB — MAGNESIUM: Magnesium: 1.9 mg/dL (ref 1.6–2.3)

## 2017-12-22 ENCOUNTER — Telehealth: Payer: Self-pay

## 2017-12-22 NOTE — Telephone Encounter (Signed)
Received call from patient concerning calling in percocet. Called patient back and left a voice mail we are calling back regarding medication. I have advised patient we do not call in pain medication and she should call the office to schedule an appointment to have her pain check out.

## 2017-12-22 NOTE — Telephone Encounter (Signed)
-----   Message from Lindell SparHeather L Bacon, [place] sent at 12/22/2017 10:20 AM EDT ----- Regarding: refill for percocet  Pt [facility] a [facility]et[facility] [place]

## 2017-12-23 ENCOUNTER — Encounter: Payer: Self-pay | Admitting: Obstetrics & Gynecology

## 2017-12-23 ENCOUNTER — Ambulatory Visit: Payer: Medicaid Other | Admitting: Obstetrics & Gynecology

## 2017-12-23 VITALS — BP 115/73 | HR 106 | Temp 97.7°F | Ht 64.0 in | Wt 168.4 lb

## 2017-12-23 DIAGNOSIS — G8918 Other acute postprocedural pain: Secondary | ICD-10-CM

## 2017-12-23 MED ORDER — OXYCODONE-ACETAMINOPHEN 5-325 MG PO TABS: 1.0000 | ORAL_TABLET | Freq: Four times a day (QID) | ORAL | 0 refills | Status: DC | PRN

## 2017-12-23 NOTE — Progress Notes (Signed)
   Subjective:    Patient ID: Michelle Riddle, Glory Rosebushfemale    DOB: 1986/04/07, 32 y.o.   MRN: [id number]  HPI  32 yo P2 here 10 days after a C/S and BTL needing more percocet. She reports some drainage from her incision. She denies fevers. She is bottlefeeding, denies any problems with her breasts.  Review of Systems     Objective:   Physical Exam Breathing, conversing, and ambulating normally Well nourished, well hydrated White female, no apparent distress Incision healing beautifully. I took a picture for her on her phone. Abd- benign     Assessment & Plan:  Postop - stable  Reassurance given #10 percocets given with no refills Encouraged her to continue to take IBU q 8 hours Come back for 4 week post op visit

## 2017-12-23 NOTE — Progress Notes (Signed)
Pt complains of having incision pain from c-section. She states that she was taking percocet and tylenol q6h. She took her last percocet this am. She denies having any drainage, odor, or swelling at incision site.

## 2018-01-18 ENCOUNTER — Encounter: Payer: Self-pay | Admitting: Family Medicine

## 2018-01-18 ENCOUNTER — Ambulatory Visit (INDEPENDENT_AMBULATORY_CARE_PROVIDER_SITE_OTHER): Payer: Medicaid Other | Admitting: Family Medicine

## 2018-01-18 DIAGNOSIS — Z141 Cystic fibrosis carrier: Secondary | ICD-10-CM | POA: Insufficient documentation

## 2018-01-18 NOTE — Progress Notes (Signed)
Post Partum Exam  Michelle Riddle is a 32 y.o. G39P2002 female who presents for a postpartum visit. She is 5 weeks postpartum following a low cervical transverse Cesarean section- after failed vacuum. I have fully reviewed the prenatal and intrapartum course. The delivery was at 40.6 gestational weeks.  Anesthesia: epidural. Postpartum course has been unremarkable. Baby's course has been remarkable for CF work up and concern for CF in infant on newborn screening. They are going to [facility] for work up. 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 Baby is feeding by bottle [name]. Bleeding no bleeding. Bowel function is normal. Bladder function is normal. Patient is sexually active. Contraception method is tubal ligation. Postpartum depression screening:neg  Edinburgh Postnatal Depression Scale - 01/18/18 1123      Edinburgh Postnatal Depression Scale:  In the Past 7 Days   I have been able to laugh and see the funny side of things.  0    I have looked forward with enjoyment to things.  0    I have blamed myself unnecessarily when things went wrong.  0    I have been anxious or worried for no good reason.  2    I have felt scared or panicky for no good reason.  0    Things have been getting on top of me.  0    I have been so unhappy that I have had difficulty sleeping.  0    I have felt sad or miserable.  0    I have been so unhappy that I have been crying.  0    The thought of harming myself has occurred to me.  0    Edinburgh Postnatal Depression Scale Total  2       The following portions of the patient's history were reviewed and updated as appropriate: allergies, current medications, past family history, past medical history, past social history, past surgical history and problem list. Last pap smear done 01/27/17 and was Normal  Review of Systems Pertinent items are noted in HPI.    Objective:  Blood pressure 122/82, pulse (!) 48, weight 169 lb (76.7 kg), not currently  breastfeeding.  General:  alert and cooperative   Breasts:  inspection negative, no nipple discharge or bleeding, no masses or nodularity palpable  Lungs: clear to auscultation bilaterally  Heart:  regular rate and rhythm, S1, S2 normal, no murmur, click, rub or gallop  Abdomen: soft, non-tender; bowel sounds normal; no masses,  no organomegaly   Vulva:  not evaluated  Vagina: not evaluated  Cervix:  not evaluated  Corpus: not examined  Adnexa:  not evaluated  Rectal Exam: Not performed.        Assessment:    Normal postpartum exam. Pap smear not done at today's visit.   Plan:   1. Contraception: tubal ligation 2. Infant feeding- formula, exclusively 3. Mood: stable, low risk. Reviewed resources.  4. Chronic Medical f/u: none   Follow up in: 1 year for physical or as needed.

## 2019-08-19 ENCOUNTER — Other Ambulatory Visit: Payer: Self-pay

## 2019-08-19 ENCOUNTER — Encounter: Payer: Self-pay | Admitting: Emergency Medicine

## 2019-08-19 ENCOUNTER — Emergency Department
Admission: EM | Admit: 2019-08-19 | Discharge: 2019-08-19 | Disposition: A | Discharge status: Home / Self Care | Payer: Medicaid Other | Attending: Emergency Medicine | Admitting: Emergency Medicine

## 2019-08-19 DIAGNOSIS — M62830 Muscle spasm of back: Secondary | ICD-10-CM

## 2019-08-19 DIAGNOSIS — M545 Low back pain, unspecified: Secondary | ICD-10-CM

## 2019-08-19 DIAGNOSIS — Z87891 Personal history of nicotine dependence: Secondary | ICD-10-CM | POA: Insufficient documentation

## 2019-08-19 LAB — URINALYSIS, COMPLETE (UACMP) WITH MICROSCOPIC
Bilirubin Urine: NEGATIVE
Glucose, UA: NEGATIVE mg/dL
Hgb urine dipstick: NEGATIVE
Ketones, ur: NEGATIVE mg/dL
Leukocytes,Ua: NEGATIVE
Nitrite: NEGATIVE
Protein, ur: 30 mg/dL — AB
Specific Gravity, Urine: 1.03 (ref 1.005–1.030)
pH: 5 (ref 5.0–8.0)

## 2019-08-19 LAB — POCT PREGNANCY, URINE: Preg Test, Ur: NEGATIVE

## 2019-08-19 MED ORDER — ORPHENADRINE CITRATE 30 MG/ML IJ SOLN
60.0000 mg | Freq: Two times a day (BID) | INTRAMUSCULAR | Status: DC
  Administered 2019-08-19: 60 mg via INTRAMUSCULAR
  Filled 2019-08-19: qty 2

## 2019-08-19 MED ORDER — PREDNISONE 10 MG PO TABS: 10.0000 mg | ORAL_TABLET | Freq: Every day | ORAL | 0 refills | Status: DC

## 2019-08-19 MED ORDER — CYCLOBENZAPRINE HCL 5 MG PO TABS: 5.0000 mg | ORAL_TABLET | Freq: Three times a day (TID) | ORAL | 0 refills | Status: DC | PRN

## 2019-08-19 MED ORDER — KETOROLAC TROMETHAMINE 30 MG/ML IJ SOLN
30.0000 mg | Freq: Once | INTRAMUSCULAR | Status: AC
  Administered 2019-08-19: 30 mg via INTRAMUSCULAR
  Filled 2019-08-19: qty 1

## 2019-08-19 NOTE — ED Triage Notes (Signed)
Pt presents to ED via POV with c/o R sided back pain since Thursday, pt states pain has been intermittent and worsening. Pt states pain initially dull then radiated high. Pt denies urinary symptoms at this time.

## 2019-08-19 NOTE — ED Notes (Signed)

## 2019-08-19 NOTE — Discharge Instructions (Addendum)
Please take medications as prescribed.  Use heating pad and work on stretching massaging muscles.  Return to the ER for any worsening symptoms or urgent changes in health.

## 2019-08-19 NOTE — ED Provider Notes (Signed)
[facility] EMERGENCY DEPARTMENT Provider Note   CSN: [id number] Arrival date & time: 08/19/19  8676     History   Chief Complaint Chief Complaint  Patient presents with  . Back Pain    HPI Michelle Riddle is a 33 y.o. female presents to the emergency department for evaluation of 3-day history of left-sided low back pain.  She describes intermittent episodes of tightness and spasms that occur every 10 minutes for 15 seconds.  She denies any trauma or injury.  No fevers, urinary symptoms, vaginal discharge bleeding.  She has tried taking 100 mg ibuprofen with very little improvement.  She denies any radicular symptoms or weakness of lower extremities.  She said similar episode several years ago that resolved with steroids.  She is currently ambulatory no assistive devices, currently denies any pain but states she is not having a episode at this time.     HPI  History reviewed. No pertinent past medical history.  Patient Active Problem List   Diagnosis Date Noted  . 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 CESAREAN SECTION N/A 12/13/2017   Procedure: CESAREAN SECTION;  Surgeon: Truett Mainland, DO;  Location: [facility];  Service: Obstetrics;  Laterality: N/A;     OB History    Gravida  2   Para  2   Term  2   Preterm  0   AB  0   Living  2     SAB  0   TAB  0   Ectopic  0   Multiple  0   Live Births  2            Home Medications    Prior to Admission medications   Medication Sig Start Date End Date Taking? Authorizing Provider  cyclobenzaprine (FLEXERIL) 5 MG tablet Take 1-2 tablets (5-10 mg total) by mouth 3 (three) times daily as needed for muscle spasms. 08/19/19   Duanne Guess, PA-C  hydrochlorothiazide (HYDRODIURIL) 25 MG tablet Take 1 tablet (25 mg total) by mouth daily. 12/20/17   Aletha Halim, MD  ibuprofen (ADVIL,MOTRIN) 100 MG/5ML suspension Take 30 mLs  (600 mg total) by mouth every 6 (six) hours. Patient not taking: Reported on 12/23/2017 12/15/17   Seabron Spates, CNM  oxyCODONE-acetaminophen (PERCOCET/ROXICET) 5-325 MG tablet Take 1 tablet by mouth every 6 (six) hours as needed for moderate pain (pain scale 4-7). 12/15/17   Seabron Spates, CNM  oxyCODONE-acetaminophen (PERCOCET/ROXICET) 5-325 MG tablet Take 1 tablet by mouth every 6 (six) hours as needed. 12/23/17   Emily Filbert, MD  polyethylene glycol (MIRALAX / GLYCOLAX) packet Take 17 g by mouth 2 (two) times daily. 12/20/17   Aletha Halim, MD  predniSONE (DELTASONE) 10 MG tablet Take 1 tablet (10 mg total) by mouth daily. 6,5,4,3,2,1 six day taper 08/19/19   Duanne Guess, PA-C  Prenatal Vit-Fe Fumarate-FA (MULTIVITAMIN-PRENATAL) 27-0.8 MG TABS tablet Take 1 tablet by mouth daily at 12 noon.    [provider]    Family History Family History  Problem Relation Age of Onset  . Diabetes Maternal Grandmother     Social History Social History   Tobacco Use  . Smoking status: Former Smoker    Packs/day: 0.50    Years: 3.00    Pack years: 1.50    Types: Cigarettes    Quit date: 09/2017    Years since quitting: 1.9  . Smokeless tobacco:  Never Used  Substance Use Topics  . Alcohol use: Yes    Comment: rarely maybe once a month  . Drug use: No     Allergies   Patient has no known allergies.   Review of Systems Review of Systems  Constitutional: Negative for fever.  Gastrointestinal: Negative for nausea and vomiting.  Genitourinary: Negative for difficulty urinating, frequency and hematuria.  Musculoskeletal: Positive for back pain and myalgias. Negative for arthralgias, gait problem and joint swelling.  Skin: Negative for rash and wound.  Neurological: Negative for numbness.     Physical Exam Updated Vital Signs BP 140/89 (BP Location: Left Arm)   Pulse 64   Temp 98.4 F (36.9 C) (Oral)   Resp 20   LMP 07/30/2019   SpO2 98%   Physical Exam  Constitutional:      Appearance: She is well-developed.  HENT:     Head: Normocephalic and atraumatic.  Eyes:     Conjunctiva/sclera: Conjunctivae normal.  Neck:     Musculoskeletal: Normal range of motion.  Cardiovascular:     Rate and Rhythm: Normal rate.  Pulmonary:     Effort: Pulmonary effort is normal. No respiratory distress.  Musculoskeletal:     Comments: Lumbar Spine: Examination of the lumbar spine reveals no bony abnormality, no edema, and no ecchymosis.  There is no step off.  The patient has full range of motion of the lumbar spine with flexion and extension.  The patient has normal lateral bend and rotation.  The patient has mild pain with lumbar flexion.  The patient has a negative axial load test, and a negative rotational Waddell test.  The patient is non tender along the spinous process.  Mildly tender along left paravertebral muscles lumbar spine with no muscle spasms noted.  The patient is non tender along the iliac crest.  The patient is non tender in the sciatic notch.  The patient is non tender along the Sacroiliac joint.  There is no Coccyx joint tenderness.    Bilateral Lower Extremities: Examination of the lower extremities reveals no bony abnormality, no edema, and no ecchymosis.  The patient has full active and passive range of motion of the hips, knees, and ankles.  There is no discomfort with range of motion exercises.  The patient is non tender along the greater trochanter region.  The patient has a negative Denna HaggardHomans' test bilaterally.  There is normal skin warmth.  There is normal capillary refill bilaterally.    Neurologic: The patient has a negative straight leg raise.  The patient has normal muscle strength testing for the quadriceps, calves, ankle dorsiflexion, ankle plantarflexion, and extensor hallicus longus.  The patient has sensation that is intact to light touch.     Skin:    General: Skin is warm.     Findings: No rash.  Neurological:     Mental  Status: She is alert and oriented to person, place, and time.  Psychiatric:        Behavior: Behavior normal.        Thought Content: Thought content normal.      ED Treatments / Results  Labs (all labs ordered are listed, but only abnormal results are displayed) Labs Reviewed  URINALYSIS, COMPLETE (UACMP) WITH MICROSCOPIC - Abnormal; Notable for the following components:      Result Value   Color, Urine YELLOW (*)    APPearance HAZY (*)    Protein, ur 30 (*)    Bacteria, UA RARE (*)  All other components within normal limits  POC URINE PREG, ED  POCT PREGNANCY, URINE    EKG None  Radiology No results found.  Procedures Procedures (including critical care time)  Medications Ordered in ED Medications  orphenadrine (NORFLEX) injection 60 mg (60 mg Intramuscular Given 08/19/19 1054)  ketorolac (TORADOL) 30 MG/ML injection 30 mg (30 mg Intramuscular Given 08/19/19 1054)     Initial Impression / Assessment and Plan / ED Course  I have reviewed the triage vital signs and the nursing notes.  Pertinent labs & imaging results that were available during my care of the patient were reviewed by me and considered in my medical decision making (see chart for details).        33 year old female with left-sided lower back pain consistent with musculoskeletal pain.  She saw significant improvement with Toradol and Norflex IM.  Urinalysis negative for UTI.  No urinary symptoms or CVA tenderness on exam.  Patient is given Flexeril and prednisone taper.  She is educated on heat, massage and activity modification.  She understands signs symptoms return to ED for.  Final Clinical Impressions(s) / ED Diagnoses   Final diagnoses:  Acute left-sided low back pain without sciatica  Muscle spasm of back    ED Discharge Orders         Ordered    cyclobenzaprine (FLEXERIL) 5 MG tablet  3 times daily PRN     08/19/19 1128    predniSONE (DELTASONE) 10 MG tablet  Daily     08/19/19  1128           Ronnette Juniper 08/19/19 1131    Sharman Cheek, MD 08/19/19 1507

## 2019-10-07 IMAGING — US US MFM OB DETAIL+14 WK
1 series · 14 of 28 positions shown · non-contrast
Comparison: none

[Series 1: us mfm ob detail+14 wk · 66 acquisitions, 14 frames shown]
[im 3/66]
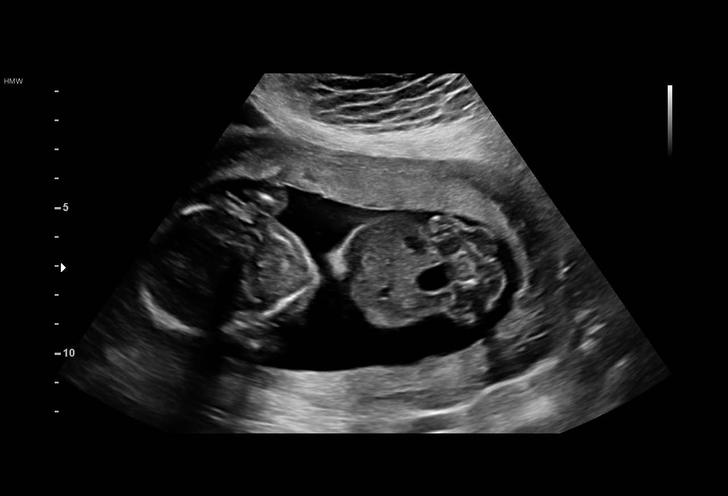
[im 8/66]
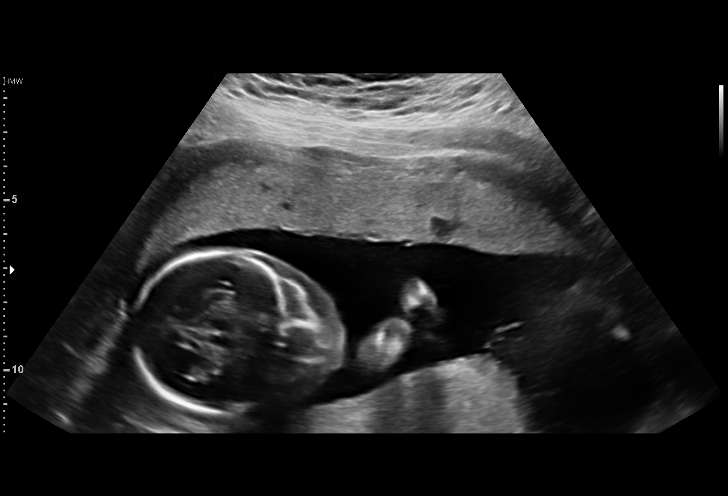
[im 13/66]
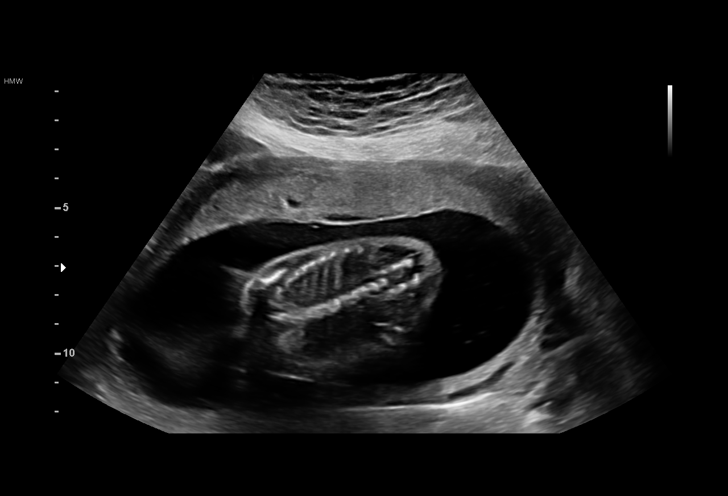
[im 17/66]
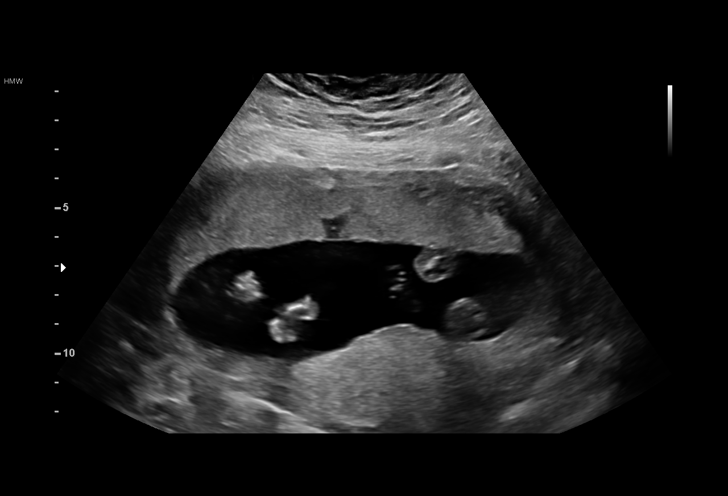
[im 22/66]
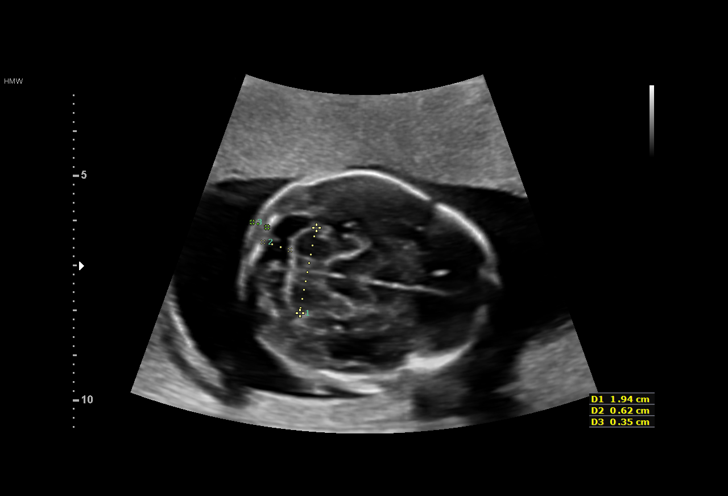
[im 27/66]
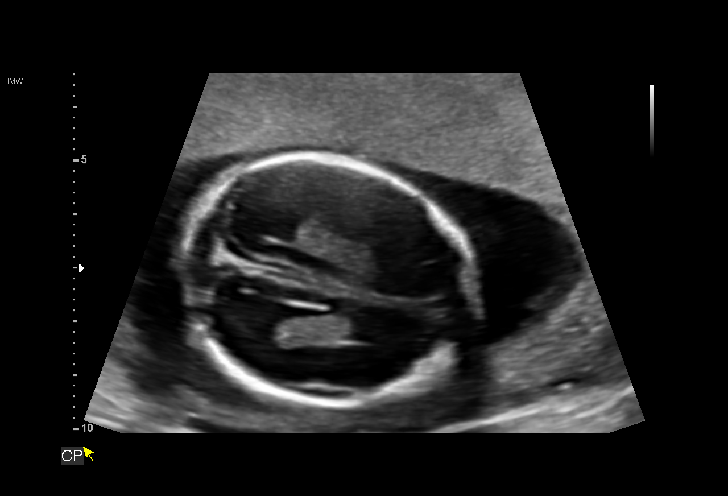
[im 32/66]
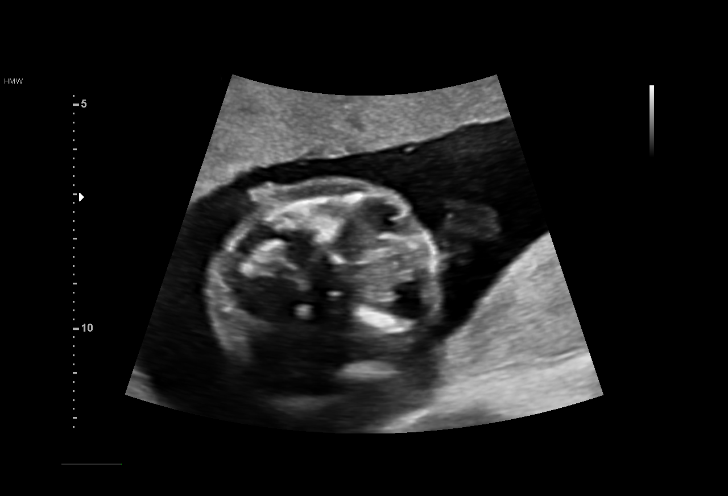
[im 37/66]
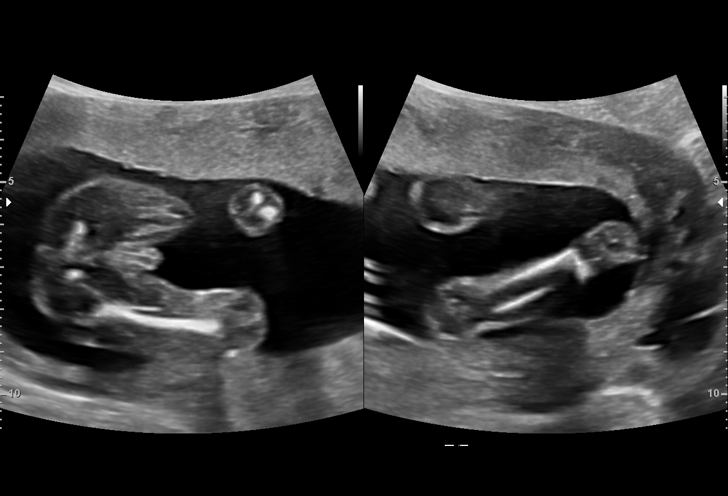
[im 41/66]
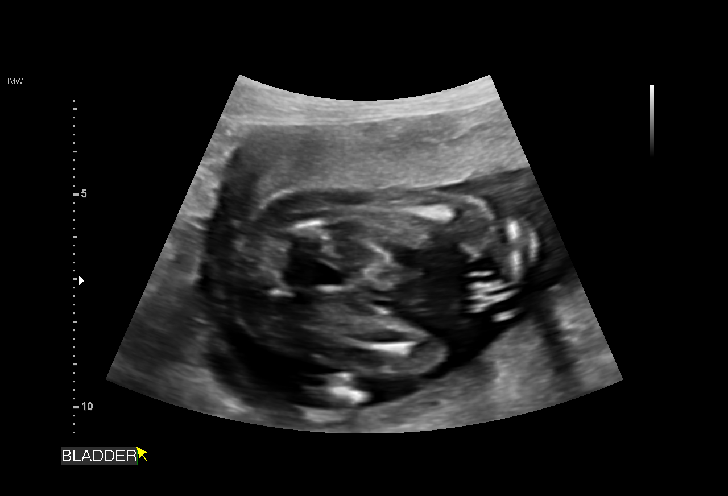
[im 46/66]
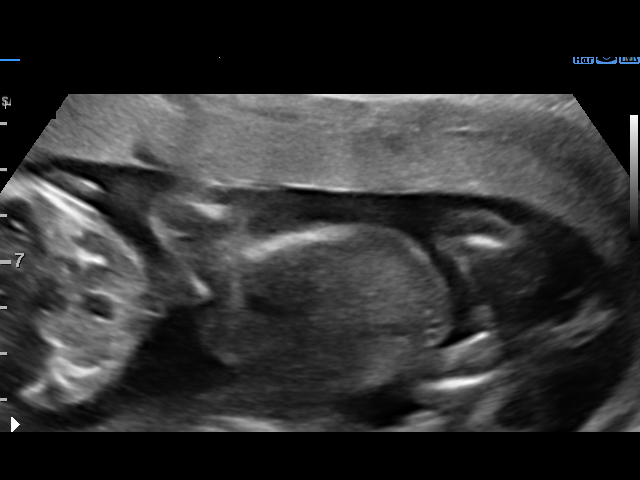
[im 51/66]
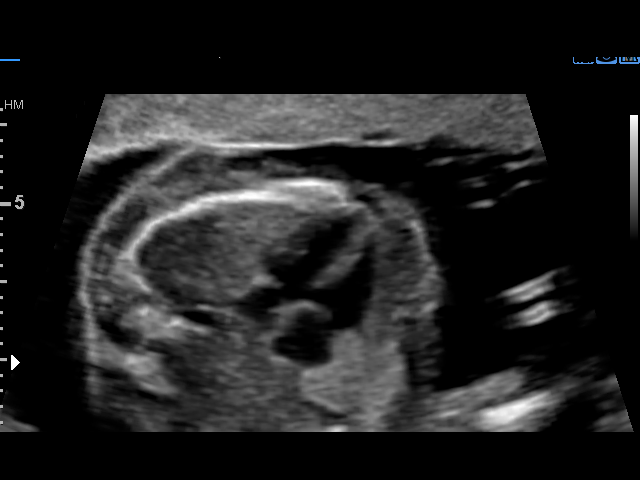
[im 56/66]
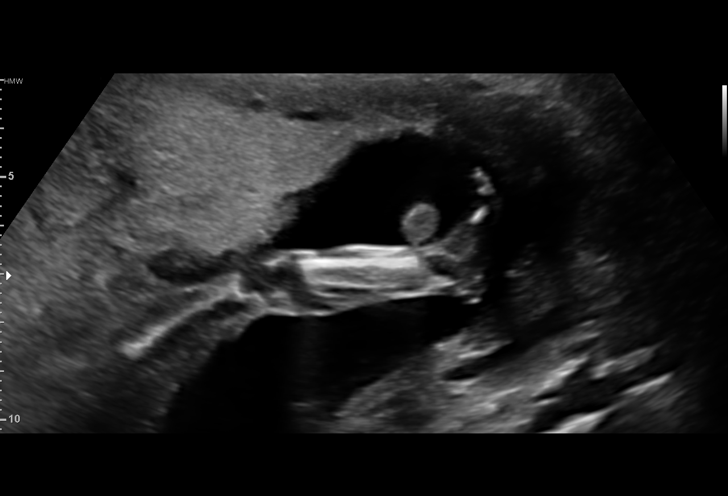
[im 61/66]
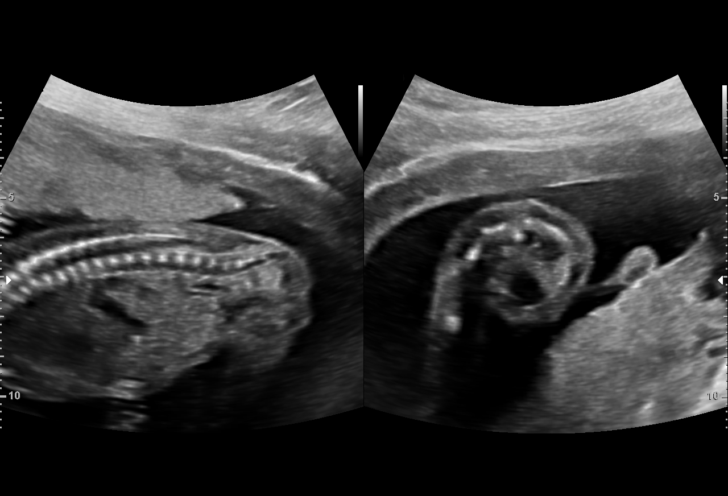
[im 66/66]
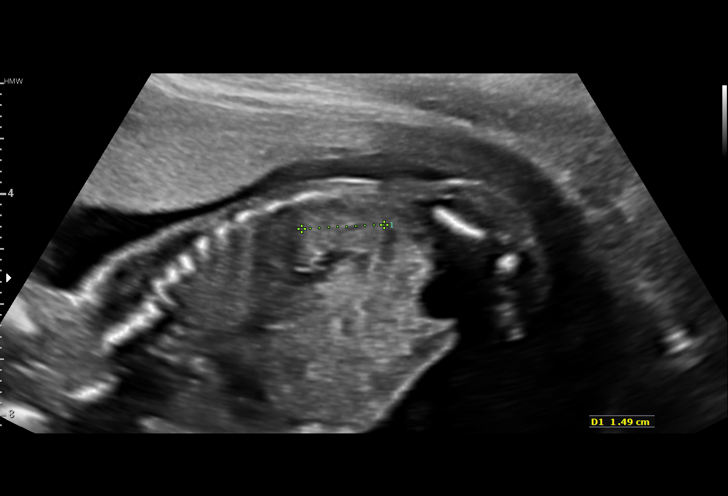

[14 of 28 positions shown; findings below may reference images not displayed]

[facility] [HOSP[facility]]

1  GILBERJHOAN BRAVO         [id number]      [id number]     [id number]
Indications

18 weeks gestation of pregnancy
Encounter for antenatal screening for
malformations
Obesity complicating pregnancy, second
trimester (pregravid BMI 30.8)
OB History

Blood Type:            Height:  5'4"   Weight (lb):  179       BMI:
Gravidity:    2         Term:   1        Prem:   0        SAB:   0
TOP:          0       Ectopic:  0        Living: 1
Fetal Evaluation

Num Of Fetuses:     1
Fetal Heart         149
Rate(bpm):
Cardiac Activity:   Observed
Presentation:       Transverse, head to maternal right
Placenta:           Anterior, above cervical os
P. Cord Insertion:  Visualized, central

Amniotic Fluid
AFI FV:      Subjectively within normal limits

Largest Pocket(cm)
5.3
Biometry

BPD:      46.5  mm     G. Age:  20w 0d         91  %    CI:        81.67   %    70 - 86
FL/HC:      17.2   %    16.1 -
HC:      162.4  mm     G. Age:  19w 0d         51  %    HC/AC:      1.12        1.09 -
AC:      145.2  mm     G. Age:  19w 6d         77  %    FL/BPD:     60.0   %
FL:       27.9  mm     G. Age:  18w 4d         33  %    FL/AC:      19.2   %    20 - 24
HUM:      29.8  mm     G. Age:  19w 6d         75  %

Est. FW:     281  gm    0 lb 10 oz      52  %
Gestational Age

U/S Today:     19w 3d                                        EDD:   12/03/17
Best:          18w 6d     Det. By:  Early Ultrasound         EDD:   12/07/17
(04/20/17)
Anatomy

Cranium:               Appears normal         Aortic Arch:            Not well visualized
Cavum:                 Appears normal         Ductal Arch:            Not well visualized
Ventricles:            Appears normal         Diaphragm:              Appears normal
Choroid Plexus:        Appears normal         Stomach:                Appears normal, left
sided
Cerebellum:            Appears normal         Abdomen:                Appears normal
Posterior Fossa:       Appears normal         Abdominal Wall:         Appears nml (cord
insert, abd wall)
Nuchal Fold:           Appears normal         Cord Vessels:           Appears normal (3
vessel cord)
Face:                  Orbits nl; profile not Kidneys:                Appear normal
well visualized
Lips:                  Appears normal         Bladder:                Appears normal
Thoracic:              Appears normal         Spine:                  Appears normal
Heart:                 Appears normal         Upper Extremities:      Appears normal
(4CH, axis, and situs
RVOT:                  Not well visualized    Lower Extremities:      Appears normal
LVOT:                  Not well visualized

Other:  Fetus appears to be a male. Heels and 5th digit visualized.
Technically difficult due to fetal position. Nasal bone visualized.
Cervix Uterus Adnexa

Cervix
Length:            4.2  cm.
Normal appearance by transabdominal scan.

Uterus
No abnormality visualized.

Left Ovary
No adnexal mass visualized.

Right Ovary
No adnexal mass visualized.

Cul De Sac:   No free fluid seen.

Adnexa:       No abnormality visualized.
Impression

Singleton intrauterine pregnancy at 18+6 weeks, here for
anatomic survey
Review of the anatomy shows no sonographic markers for
aneuploidy or structural anomalies
However, cardiac anatomy evaluation should be considered
suboptimal secondary to fetal osition
Amniotic fluid volume is normal
Estimated fetal weight is 281g which is growth in the 52nd
percentile
Recommendations

Recommend follow-up ultrasound examination in 4 weeks to
complete anatomic survey

## 2019-10-13 IMAGING — US US MFM OB LIMITED
1 series · 14 of 28 positions shown · non-contrast
Comparison: none

[Series 1: us mfm ob limited · 14 of 49 slices shown]
[im 2/49]
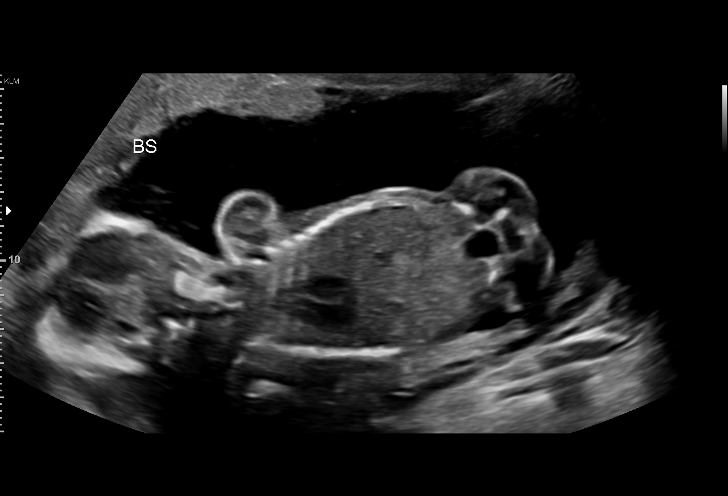
[im 6/49]
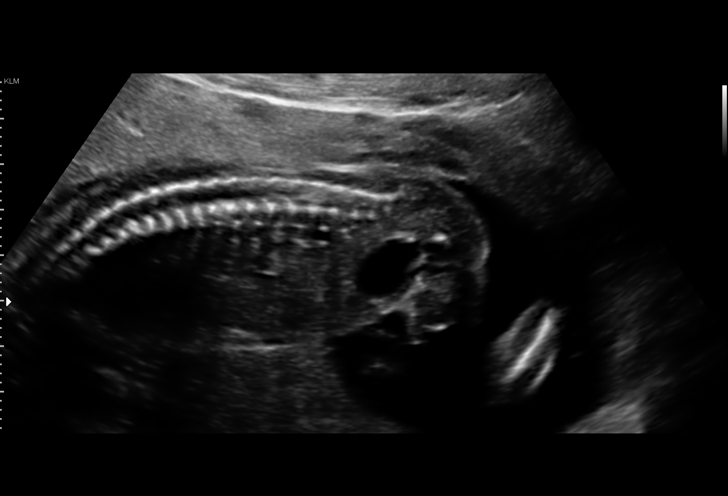
[im 9/49]
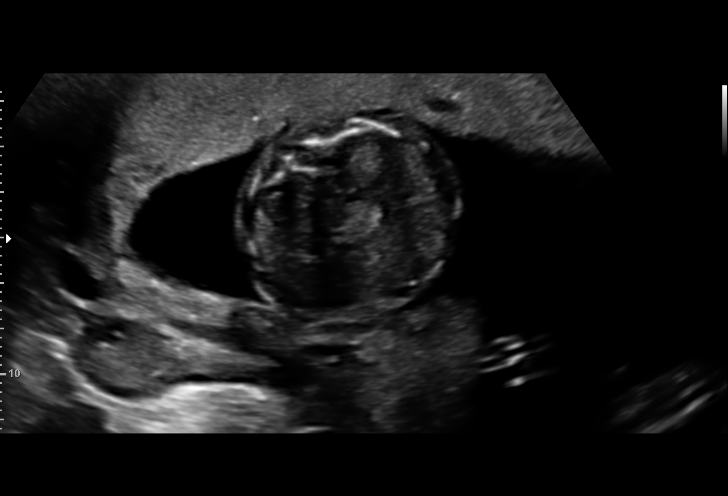
[im 13/49]
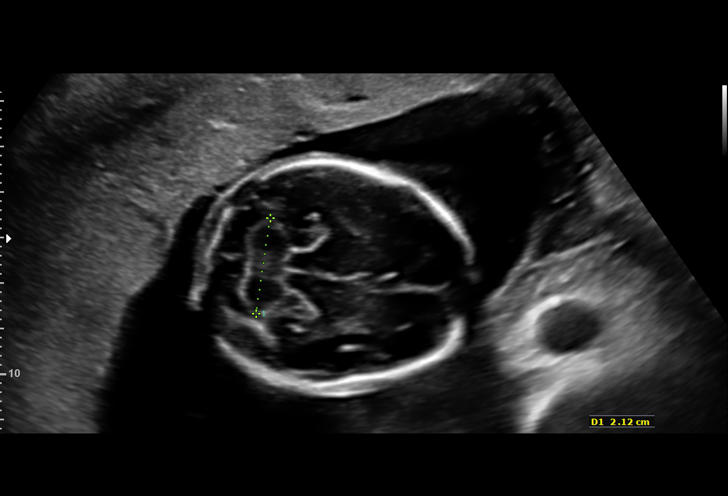
[im 17/49]
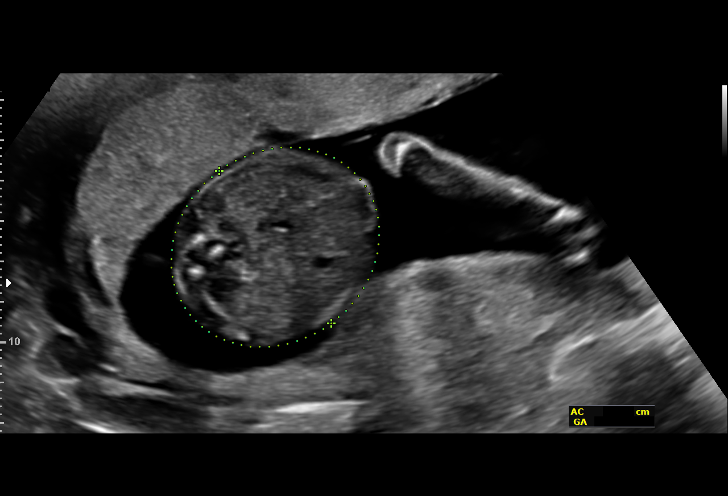
[im 20/49]
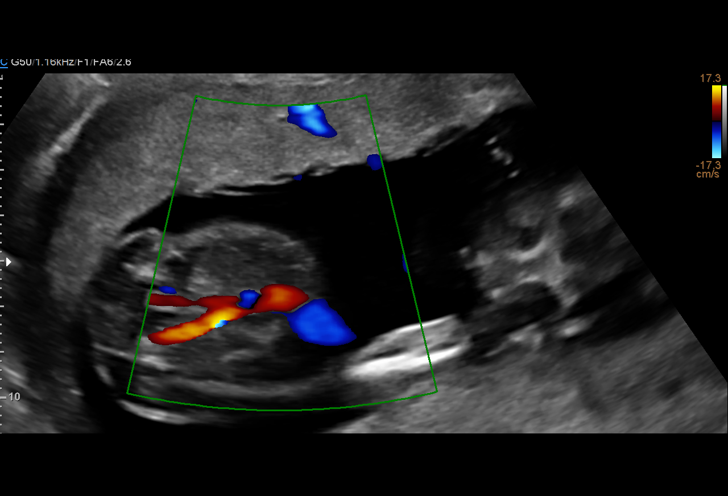
[im 24/49]
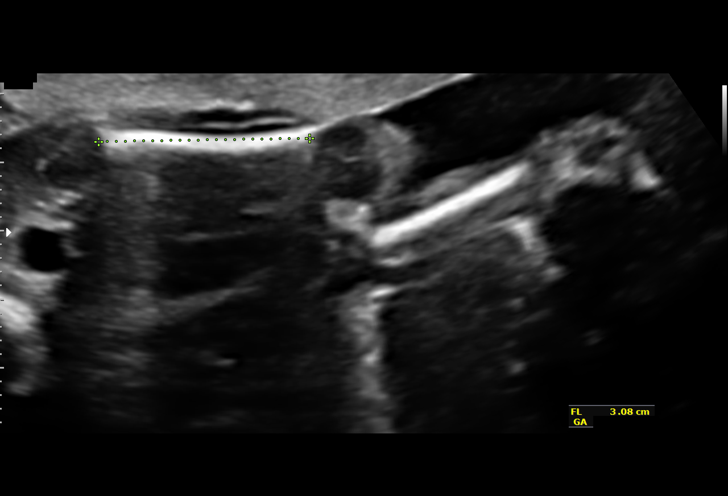
[im 27/49]
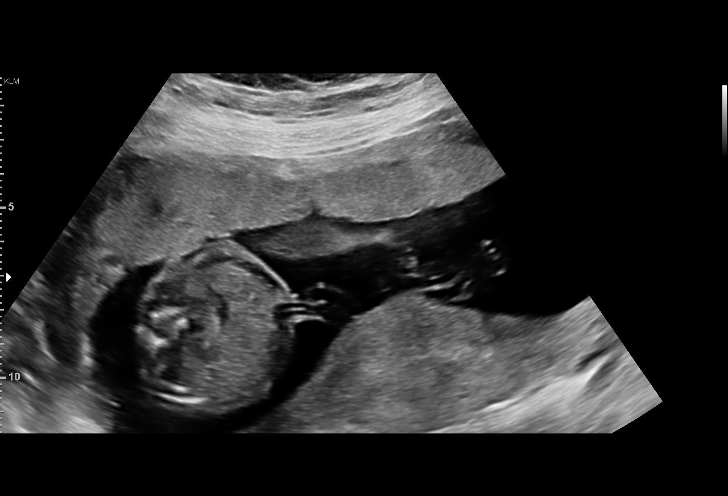
[im 31/49]
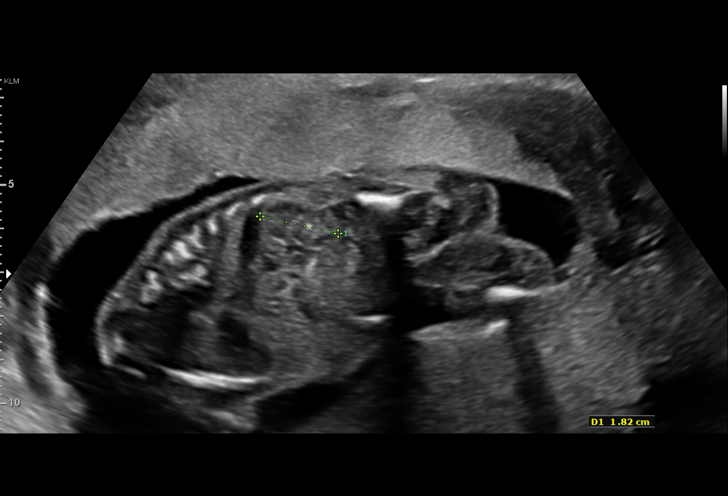
[im 34/49]
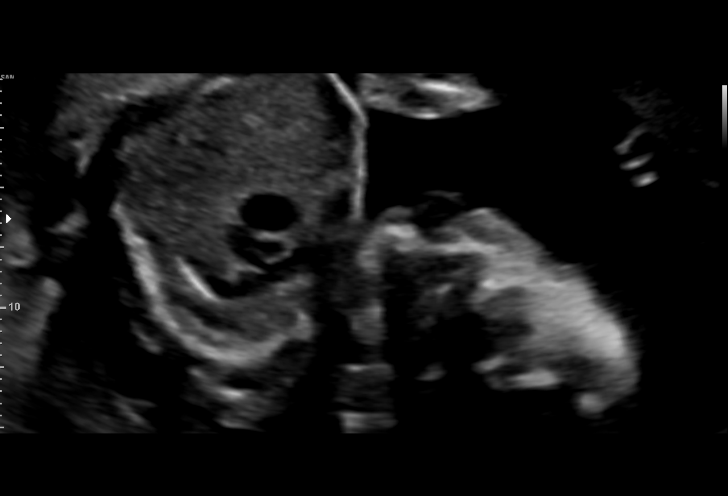
[im 38/49]
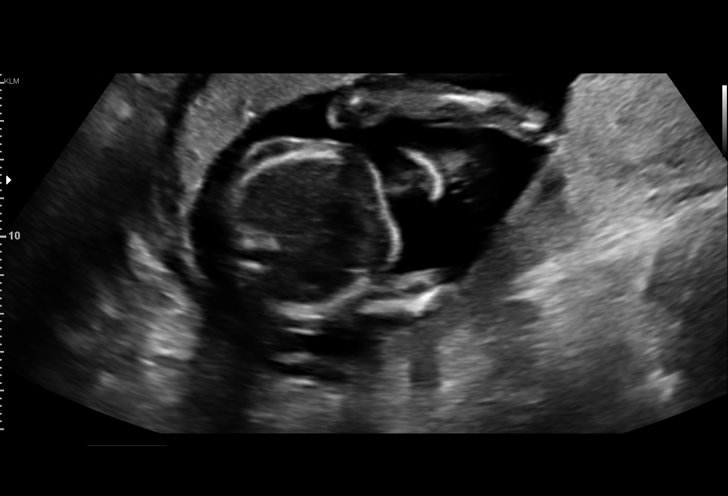
[im 41/49]
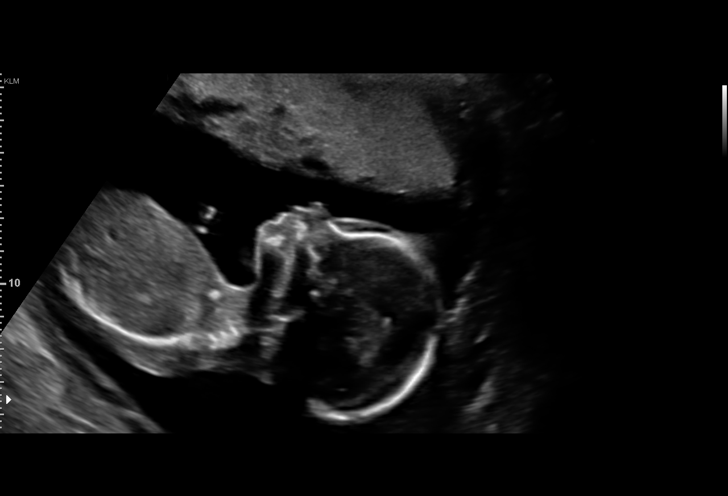
[im 45/49]
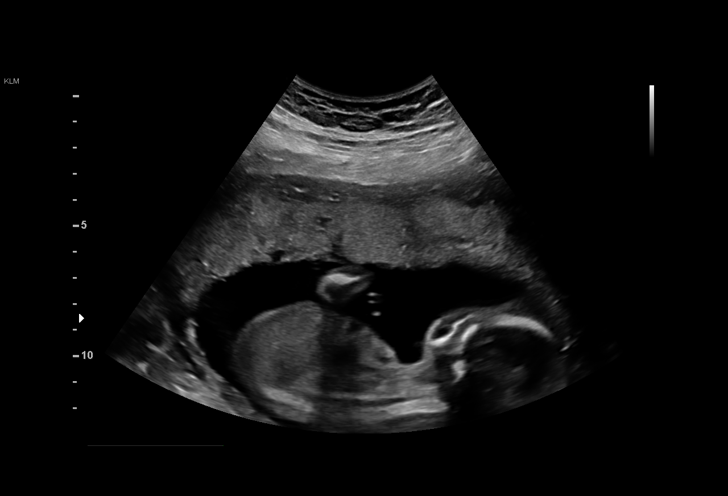
[im 49/49]
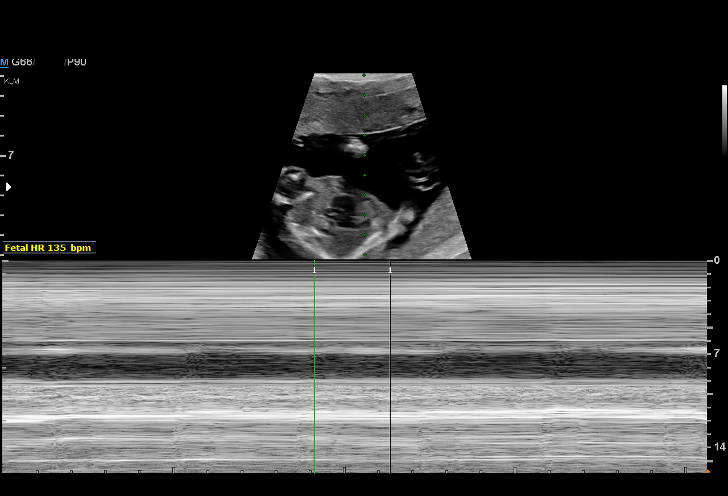

[14 of 28 positions shown; findings below may reference images not displayed]

[facility] [HOSP[facility]]

1  MANETT EL-AMIN         [id number]      [id number]     [id number]
Indications

19 weeks gestation of pregnancy
Antenatal follow-up for nonvisualized fetal
anatomy
Obesity complicating pregnancy, second
trimester (pregravid BMI 30.8)
OB History

Blood Type:            Height:  5'4"   Weight (lb):  179       BMI:
Gravidity:    2         Term:   1        Prem:   0        SAB:   0
TOP:          0       Ectopic:  0        Living: 1
Fetal Evaluation

Num Of Fetuses:     1
Fetal Heart         135
Rate(bpm):
Cardiac Activity:   Observed
Presentation:       Cephalic
Placenta:           Anterior, above cervical os
P. Cord Insertion:  Visualized

Amniotic Fluid
AFI FV:      Subjectively within normal limits

Largest Pocket(cm)
3.59
Gestational Age

Best:          19w 5d     Det. By:  Early Ultrasound         EDD:   12/07/17
(04/20/17)
Anatomy

Cranium:               Appears normal         Aortic Arch:            Appears normal
Cavum:                 Appears normal         Ductal Arch:            Appears normal
Ventricles:            Appears normal         Diaphragm:              Appears normal
Choroid Plexus:        Appears normal         Stomach:                Appears normal, left
sided
Cerebellum:            Appears normal         Abdomen:                Appears normal
Posterior Fossa:       Appears normal         Abdominal Wall:         Appears nml (cord
insert, abd wall)
Nuchal Fold:           Appears normal         Cord Vessels:           Appears normal (3
vessel cord)
Face:                  Appears normal         Kidneys:                Appear normal
(orbits and profile)
Lips:                  Appears normal         Bladder:                Appears normal
Thoracic:              Appears normal         Spine:                  Appears normal
Heart:                 Appears normal         Upper Extremities:      Previously seen
(4CH, axis, and situs
RVOT:                  Appears normal         Lower Extremities:      Previously seen
LVOT:                  Appears normal

Other:  Fetus appears to be a male. Heels and 5th digit previously visualized.
Technically difficult due to fetal position. Nasal bone previously
visualized.
Cervix Uterus Adnexa

Cervix
Length:            3.9  cm.
Normal appearance by transabdominal scan.
Impression

IUP at 19+5 weeks, here to complete anatomic survey
Normal fetal movement and cardiac activity
Interval review of anatomy is normal; all relevant anatomy
has been visualized
Recommendations

Follow-up ultrasounds as clinically indicated.

## 2020-01-19 DIAGNOSIS — R109 Unspecified abdominal pain: Secondary | ICD-10-CM | POA: Insufficient documentation

## 2020-01-19 DIAGNOSIS — R001 Bradycardia, unspecified: Secondary | ICD-10-CM | POA: Insufficient documentation

## 2020-01-19 DIAGNOSIS — R1110 Vomiting, unspecified: Secondary | ICD-10-CM | POA: Insufficient documentation

## 2020-02-11 ENCOUNTER — Emergency Department
Admission: EM | Admit: 2020-02-11 | Discharge: 2020-02-11 | Disposition: A | Discharge status: Home / Self Care | Payer: Medicaid Other | Attending: Emergency Medicine | Admitting: Emergency Medicine

## 2020-02-11 ENCOUNTER — Encounter: Payer: Self-pay | Admitting: *Deleted

## 2020-02-11 ENCOUNTER — Other Ambulatory Visit: Payer: Self-pay

## 2020-02-11 DIAGNOSIS — M549 Dorsalgia, unspecified: Secondary | ICD-10-CM | POA: Insufficient documentation

## 2020-02-11 DIAGNOSIS — Z5321 Procedure and treatment not carried out due to patient leaving prior to being seen by health care provider: Secondary | ICD-10-CM | POA: Diagnosis not present

## 2020-02-11 LAB — COMPREHENSIVE METABOLIC PANEL
ALT: 10 U/L (ref 0–44)
AST: 12 U/L — ABNORMAL LOW (ref 15–41)
Albumin: 4.2 g/dL (ref 3.5–5.0)
Alkaline Phosphatase: 44 U/L (ref 38–126)
Anion gap: 8 (ref 5–15)
BUN: 12 mg/dL (ref 6–20)
CO2: 28 mmol/L (ref 22–32)
Calcium: 8.6 mg/dL — ABNORMAL LOW (ref 8.9–10.3)
Chloride: 103 mmol/L (ref 98–111)
Creatinine, Ser: 0.72 mg/dL (ref 0.44–1.00)
GFR calc Af Amer: >60 mL/min (ref >60)
GFR calc non Af Amer: >60 mL/min (ref >60)
Glucose, Bld: 89 mg/dL (ref 70–99)
Potassium: 4.1 mmol/L (ref 3.5–5.1)
Sodium: 139 mmol/L (ref 135–145)
Total Bilirubin: 0.5 mg/dL (ref 0.3–1.2)
Total Protein: 7.4 g/dL (ref 6.5–8.1)

## 2020-02-11 LAB — URINALYSIS, COMPLETE (UACMP) WITH MICROSCOPIC
Bilirubin Urine: NEGATIVE
Glucose, UA: NEGATIVE mg/dL
Ketones, ur: NEGATIVE mg/dL
Nitrite: NEGATIVE
Protein, ur: 100 mg/dL — AB
Specific Gravity, Urine: 1.017 (ref 1.005–1.030)
WBC, UA: >50 WBC/hpf — ABNORMAL HIGH (ref 0–5)
pH: 7 (ref 5.0–8.0)

## 2020-02-11 LAB — CBC
HCT: 37 % (ref 36.0–46.0)
Hemoglobin: 12.6 g/dL (ref 12.0–15.0)
MCH: 30.5 pg (ref 26.0–34.0)
MCHC: 34.1 g/dL (ref 30.0–36.0)
MCV: 89.6 fL (ref 80.0–100.0)
Platelets: 257 10*3/uL (ref 150–400)
RBC: 4.13 MIL/uL (ref 3.87–5.11)
RDW: 12.6 % (ref 11.5–15.5)
WBC: 6.8 10*3/uL (ref 4.0–10.5)
nRBC: 0 % (ref 0.0–0.2)

## 2020-02-11 LAB — POCT PREGNANCY, URINE: Preg Test, Ur: NEGATIVE

## 2020-02-11 NOTE — ED Notes (Signed)
poct pregnancy Negative 

## 2020-02-11 NOTE — ED Triage Notes (Signed)
Pt reports left mid back pain.  Pt reports stent placed in kidney this month.  No diff urinating.  No known injury to back.  Pt alert  Speech clear.

## 2020-05-15 DIAGNOSIS — F17200 Nicotine dependence, unspecified, uncomplicated: Secondary | ICD-10-CM | POA: Insufficient documentation

## 2021-12-07 ENCOUNTER — Other Ambulatory Visit: Payer: Self-pay

## 2021-12-07 ENCOUNTER — Ambulatory Visit (INDEPENDENT_AMBULATORY_CARE_PROVIDER_SITE_OTHER): Payer: Medicaid Other | Admitting: Psychiatry

## 2021-12-07 ENCOUNTER — Encounter: Payer: Self-pay | Admitting: Psychiatry

## 2021-12-07 VITALS — BP 135/86 | HR 71 | Temp 98.3°F | Wt 175.2 lb

## 2021-12-07 DIAGNOSIS — F1421 Cocaine dependence, in remission: Secondary | ICD-10-CM | POA: Diagnosis not present

## 2021-12-07 DIAGNOSIS — R41840 Attention and concentration deficit: Secondary | ICD-10-CM | POA: Diagnosis not present

## 2021-12-07 DIAGNOSIS — F32A Depression, unspecified: Secondary | ICD-10-CM | POA: Diagnosis not present

## 2021-12-07 DIAGNOSIS — F1290 Cannabis use, unspecified, uncomplicated: Secondary | ICD-10-CM | POA: Diagnosis not present

## 2021-12-07 NOTE — Patient Instructions (Signed)
Cannabis Use Disorder ?Cannabis use disorder occurs when marijuana use disrupts a person's daily life or causes health problems. This condition can be dangerous. The health problems this condition can cause include: ?Long-lasting problems with thinking and learning. These can be permanent in young people. ?Mental health problems, such as severe anxiety, paranoia, hallucinations, or schizophrenia. ?Dangerously high blood pressure and heart rate. ?Breathing problems. ?Problems with child development during and after pregnancy. ?People with this condition are also more likely to use other drugs. ?What are the causes? ?This condition is caused by using marijuana too much over time. It is not caused by using it only once in a while. Many people with this condition use marijuana because it gives them a feeling of extreme pleasure or relaxation. ?What increases the risk? ?This condition is more likely to develop in: ?Men. ?People with a family history of cannabis use disorder. ?People with mental health issues such as depression or post-traumatic stress disorder (PTSD). ?What are the signs or symptoms? ?Symptoms of this condition include: ?Addiction ?Using greater amounts of marijuana than you want to, or using marijuana for longer than you want to. ?Craving marijuana. ?Spending a lot of time getting marijuana, using it, or recovering from its effects. ?Having problems at work, at school, at home, or in relationships because of marijuana use. ?Giving up or cutting down on important life activities because of marijuana use. ?Using marijuana at times when it is dangerous, such as while you are driving a car. ?Needing more and more marijuana to get the same desired effect (building up a tolerance). ?Lack of motivation, known as amotivational syndrome, which leads to poor school and work performance. ?Physical problems ?A long-lasting cough. ?Bronchitis. ?Emphysema. ?Throat and lung cancer. ?Mental  problems ?Psychosis. ?Anxiety. ?Trouble sleeping. ?Increase in violent behavior in young people. ?Withdrawal problems ?You may have symptoms when you stop using marijuana. Symptoms include: ?Irritability or anger. ?Anxiety or restlessness. ?Trouble sleeping. ?Loss of appetite or weight loss. ?Aches and pains. ?Shakiness, sweating, or chills. ?How is this diagnosed? ?This condition is diagnosed with an assessment. During the assessment, your health care provider will ask about your marijuana use and how it affects your life. You will be diagnosed with the condition if you have had at least two symptoms of this condition within a 69-month period. How severe the condition is depends on how many symptoms you have. ?If you have two to three symptoms, your condition is mild. ?If you have four to five symptoms, your condition is moderate. ?If you have six or more symptoms, your condition is severe. ?Your health care provider may perform a physical exam or do lab tests to see if you have physical problems resulting from marijuana use. Your health care provider may also screen for drug use and refer you to a mental health professional for evaluation. ?How is this treated? ?Treatment for this condition is usually provided by mental health professionals with training in substance use disorders. Your treatment may involve: ?Counseling. This treatment is also called talk therapy. It is provided by substance use treatment counselors. A counselor can address the reasons you use marijuana and suggest ways to keep you from using it again. The goals of talk therapy are to: ?Find healthy activities to replace using marijuana. ?Identify and avoid the things that trigger your marijuana use. ?Help you learn how to handle cravings. ?Support groups. Support groups are led by people who have quit using marijuana. They provide emotional support, advice, and guidance. ?Medicine. Medicine is used  to treat mental health issues that trigger  marijuana use or that result from it. ?Follow these instructions at home: ?Lifestyle ?Make healthy lifestyle choices, such as: ?Eating a healthy diet. ?Getting enough exercise. ?Improving your stress-management skills. ? ?General instructions ?Take over-the-counter, prescription medicines, and herbal remedies only as told by your health care provider. ?Check with your health care provider before starting any new medicines. ?Work with Photographer or group to develop tools to keep you from using marijuana again (relapsing). ?Learn daily living skills and work skills. ?Keep all follow-up visits as told by your health care provider. This is important. ?Where to find more information ?[employer] on Drug Abuse: http://www.price-smith.com/ ?Centers for Disease Control and Prevention: FootballExhibition.com.br ?Substance Abuse and Mental Health Services Administration: SkateOasis.com.pt ?Contact a health care provider if: ?You are not able to take your medicines as told. ?Your symptoms get worse. ?Get help right away if: ?You have serious thoughts about hurting yourself or others. ?If you ever feel like you may hurt yourself or others, or have thoughts about taking your own life, get help right away. You can go to your nearest emergency department or call: ?Your local emergency services (911 in the [place] ?A suicide crisis helpline, such as the National Suicide Prevention Lifeline at 9171859277 or 988 in the [place] This is open 24 hours a day in the [place] ?Summary ?Cannabis use disorder is when using marijuana disrupts a person's daily life or causes health problems. ?This condition is caused by using marijuana too much over time. ?Treatment for this condition is usually provided by mental health professionals with training in substance use disorders. ?Treatment may involve counseling, support groups, or medicine. ?This information is not intended to replace advice given to you by your health care provider. Make sure you discuss any  questions you have with your health care provider. ?Document Revised: 04/01/2021 Document Reviewed: 08/07/2019 ?Elsevier Patient Education ? 2022 [employer] ? ?
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
## 2021-12-21 ENCOUNTER — Ambulatory Visit (INDEPENDENT_AMBULATORY_CARE_PROVIDER_SITE_OTHER): Payer: Medicaid Other | Admitting: Licensed Clinical Social Worker

## 2021-12-21 DIAGNOSIS — F4323 Adjustment disorder with mixed anxiety and depressed mood: Secondary | ICD-10-CM

## 2021-12-21 NOTE — Progress Notes (Signed)
Virtual Visit via Video Note ? ?I connected with Michelle Riddle on 12/21/21 at 10:00 AM EDT by a video enabled telemedicine application and verified that I am speaking with the correct person using two identifiers. ? ?Location: ?Patient:home ?Provider: remote office [place], [place]) ?  ?I discussed the limitations of evaluation and management by telemedicine and the availability of in person appointments. The patient expressed understanding and agreed to proceed. ?  ?I discussed the assessment and treatment plan with the patient. The patient was provided an opportunity to ask questions and all were answered. The patient agreed with the plan and demonstrated an understanding of the instructions. ?  ?The patient was advised to call back or seek an in-person evaluation if the symptoms worsen or if the condition fails to improve as anticipated. ? ?I provided 60 minutes of non-face-to-face time during this encounter. ? ? ?Michelle Riddle R Reise Gladney, LCSW ?Comprehensive Clinical Assessment (CCA) Note ? ?12/21/2021 ?Michelle Riddle ?[id number] ? ?Chief Complaint:  ?Chief Complaint  ?Patient presents with  ? Establish Care  ? Adjustment Disorder  ? ?Visit Diagnosis:  ?  ?Encounter Diagnosis  ?Name Primary?  ? Adjustment disorder with mixed anxiety and depressed mood Yes  ? ?Michelle Riddle is a 36 year old female reporting to [facility] virtually to establish psychotherapy services. Patient reports that she is currently experiencing irritability, anger, and decreased focus and concentration. patient reports that she has some situational depression and situational anxiety. Patient reports a family history of ADHD and bipolar disorder. Patient denies any psychiatric hospitalizations in the past, or chronic pain. Patient denies any current suicidal ideation, homicidal ideation, or any perceptual disturbances. Patient denies any substance use other than vaping nicotine. This information is in conflict from what was disclosed to psychiatrist recently--patient  admitted that she has used cocaine in the past and currently uses marijuana on a daily basis. Patient currently resides with boyfriend, her four year old son, and her 73 year old daughter. Patient reports that sometimes boyfriend?s children will come over every other weekend. Patient reports that recently she has not been seeing her 12 year old daughter because her daughter is jealous of patients relationship with her boyfriend, and wants her mother all to herself. Patient related to her daughter as her ? problem child?. currently patients 36 year old daughter has not been visiting with her mother, and stays with her father. Patient reports that her four year old son has autism, and that she is very busy taking him back and forth to get access to resources that are helpful to him. 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 ? ?CCA Screening, Triage and Referral (STR) ? ?Patient Reported Information ?How did you hear about [place]? No data recorded ?Referral name: Dr. Jomarie Longs ? ?Referral phone number: No data recorded ? ?Whom do you see for routine medical problems? No data recorded ?Practice/Facility Name: No data recorded ?Practice/Facility Phone Number: No data recorded ?Name of Contact: No data recorded ?Contact Number: No data recorded ?Contact Fax Number: No data recorded ?Prescriber Name: No data recorded ?Prescriber Address (if known): No data recorded ? ?What Is the Reason for Your Visit/Call Today? Establish psychotherapy services ? ?How Long Has This Been Causing You Problems? > than 6 months ? ?What Do You Feel Would Help You the Most Today? Treatment for Depression or other mood problem ? ? ?Have You Recently Been in Any Inpatient Treatment (Hospital/Detox/Crisis Center/28-Day Program)? No ? ?Name/Location of Program/Hospital:No data  recorded ?How Long Were You There? No data recorded ?When Were You Discharged? No data recorded ? ?Have You Ever Received  Services From [facility] Before? Yes ? ?Who Do You See at [facility]? No data recorded ? ?Have You Recently Had Any Thoughts About Hurting Yourself? No ? ?Are You Planning to Commit Suicide/Harm Yourself At This time? No ? ? ?Have you Recently Had Thoughts About Hurting Someone Karolee Ohs? No ? ?Explanation: No data recorded ? ?Have You Used Any Alcohol or Drugs in the Past 24 Hours? No ? ?How Long Ago Did You Use Drugs or Alcohol? No data recorded ?What Did You Use and How Much? No data recorded ? ?Do You Currently Have a Therapist/Psychiatrist? Yes ? ?Name of Therapist/Psychiatrist: Dr. Jomarie Longs ? ? ?Have You Been Recently Discharged From Any Office Practice or Programs? No ? ?Explanation of Discharge From Practice/Program: No data recorded ? ?  ?CCA Screening Triage Referral Assessment ?Type of Contact: Tele-Assessment ? ?Is this Initial or Reassessment? Initial Assessment ? ?Date Telepsych consult ordered in [facility]:  No data recorded ?Time Telepsych consult ordered in [facility]:  No data recorded ? ?Patient Reported Information Reviewed? No data recorded ?Patient Left Without Being Seen? No data recorded ?Reason for Not Completing Assessment: No data recorded ? ?Collateral Involvement: No data recorded ? ?Does Patient Have a Automotive engineer Guardian? No data recorded ?Name and Contact of Legal Guardian: No data recorded ?If Minor and Not Living with Parent(s), Who has Custody? n/a ? ?Is CPS involved or ever been involved? Never ? ?Is APS involved or ever been involved? Never ? ? ?Patient Determined To Be At Risk for Harm To Self or Others Based on Review of Patient Reported Information or Presenting Complaint? No ? ?Method: No data recorded ?Availability of Means: No data recorded ?Intent: No data recorded ?Notification Required: No data recorded ?Additional Information for Danger to Others Potential: No data recorded ?Additional Comments for Danger to Others Potential: No data recorded ?Are There Guns or Other  Weapons in Your Home? No data recorded ?Types of Guns/Weapons: No data recorded ?Are These Weapons Safely Secured?                            No data recorded ?Who Could Verify You Are Able To Have These Secured: No data recorded ?Do You Have any Outstanding Charges, Pending Court Dates, Parole/Probation? No data recorded ?Contacted To Inform of Risk of Harm To Self or Others: Other: Comment (n/a) ? ? ?Location of Assessment: No data recorded ? ?Does Patient Present under Involuntary Commitment? No ? ?IVC Papers Initial File Date: No data recorded ? ?[place] of Residence: [place] ? ? ?Patient Currently Receiving the Following Services: Medication Management ? ? ?Determination of Need: Routine (7 days) ? ? ?Options For Referral: Outpatient Therapy; Medication Management ? ? ? ? ?CCA Biopsychosocial ?Intake/Chief Complaint:  Michelle Riddle is a 36 year old female reporting to [facility] virtually to establish psychotherapy services. Patient reports that she is currently experiencing irritability, anger, and decreased focus and concentration. patient reports that she has some situational depression and situational anxiety. Patient reports a family history of ADHD and bipolar disorder. Patient denies any psychiatric hospitalizations in the past, or chronic pain. Patient denies any current suicidal ideation, homicidal ideation, or any perceptual disturbances. Patient denies any substance use other than vaping nicotine. This information is contradictory to psychiatric record from recent appointment--pt states that she uses THC on a daily  basis and has used cocaine in the past.  Patient currently resides with boyfriend, her four year old son, and her 36 year old daughter. Patient reports that sometimes boyfriend?s children will come over every other weekend. Patient reports that recently she has not been seeing her 36 year old daughter because her daughter is jealous of patients relationship with her boyfriend, and wants her mother all  to herself. Patient related to her daughter as her ? problem child?. Currently patients 36 year old daughter has not been visiting with her mother, and stays with her father. Patient reports that her fo

## 2021-12-21 NOTE — Plan of Care (Signed)
Developed treatment plan using pt input 

## 2022-02-02 ENCOUNTER — Ambulatory Visit (INDEPENDENT_AMBULATORY_CARE_PROVIDER_SITE_OTHER): Payer: Self-pay | Admitting: Licensed Clinical Social Worker

## 2022-02-02 ENCOUNTER — Telehealth: Payer: Self-pay | Admitting: Licensed Clinical Social Worker

## 2022-02-02 DIAGNOSIS — Z91199 Patient's noncompliance with other medical treatment and regimen due to unspecified reason: Secondary | ICD-10-CM

## 2022-02-02 NOTE — Telephone Encounter (Signed)
LCSW counselor tried to connect with patient for scheduled appointment via MyChart video text request x 2 and email request; also tried to connect via phone without success. LCSW counselor left message for patient to call office number to reschedule OPT appointment.  ? ?Attempt 1: Text and email: 3:04p ? ?Attempt 2: Text and email:3:10p ? ?Attempt 3: phone call: 3:17p --left message ? ? ?Visit will be coded as No Show ?

## 2022-02-02 NOTE — Progress Notes (Signed)
LCSW counselor tried to connect with patient for scheduled appointment via MyChart video text request x 2 and email request; also tried to connect via phone without success. LCSW counselor left message for patient to call office number to reschedule OPT appointment.  ? ?Attempt 1: Text and email: 3:04p ? ?Attempt 2: Text and email:3:10p ? ?Attempt 3: phone call: 3:17p --left message ? ? ?Visit will be coded as No Show ?

## 2022-06-21 ENCOUNTER — Emergency Department: Payer: Medicaid Other

## 2022-06-21 ENCOUNTER — Emergency Department
Admission: EM | Admit: 2022-06-21 | Discharge: 2022-06-21 | Disposition: A | Discharge status: Home / Self Care | Payer: Medicaid Other | Attending: Emergency Medicine | Admitting: Emergency Medicine

## 2022-06-21 ENCOUNTER — Other Ambulatory Visit: Payer: Self-pay

## 2022-06-21 ENCOUNTER — Encounter: Payer: Self-pay | Admitting: Emergency Medicine

## 2022-06-21 DIAGNOSIS — M542 Cervicalgia: Secondary | ICD-10-CM | POA: Diagnosis not present

## 2022-06-21 DIAGNOSIS — S6992XA Unspecified injury of left wrist, hand and finger(s), initial encounter: Secondary | ICD-10-CM | POA: Diagnosis not present

## 2022-06-21 DIAGNOSIS — S0993XA Unspecified injury of face, initial encounter: Secondary | ICD-10-CM | POA: Diagnosis present

## 2022-06-21 DIAGNOSIS — S6990XA Unspecified injury of unspecified wrist, hand and finger(s), initial encounter: Secondary | ICD-10-CM

## 2022-06-21 DIAGNOSIS — S00531A Contusion of lip, initial encounter: Secondary | ICD-10-CM | POA: Diagnosis not present

## 2022-06-21 DIAGNOSIS — R519 Headache, unspecified: Secondary | ICD-10-CM | POA: Diagnosis not present

## 2022-06-21 LAB — CBC WITH DIFFERENTIAL/PLATELET
Abs Immature Granulocytes: 0.02 10*3/uL (ref 0.00–0.07)
Basophils Absolute: 0 10*3/uL (ref 0.0–0.1)
Basophils Relative: 0 %
Eosinophils Absolute: 0 10*3/uL (ref 0.0–0.5)
Eosinophils Relative: 1 %
HCT: 41.5 % (ref 36.0–46.0)
Hemoglobin: 13.8 g/dL (ref 12.0–15.0)
Immature Granulocytes: 0 %
Lymphocytes Relative: 29 %
Lymphs Abs: 1.7 10*3/uL (ref 0.7–4.0)
MCH: 32 pg (ref 26.0–34.0)
MCHC: 33.3 g/dL (ref 30.0–36.0)
MCV: 96.3 fL (ref 80.0–100.0)
Monocytes Absolute: 0.4 10*3/uL (ref 0.1–1.0)
Monocytes Relative: 7 %
Neutro Abs: 3.8 10*3/uL (ref 1.7–7.7)
Neutrophils Relative %: 63 %
Platelets: 278 10*3/uL (ref 150–400)
RBC: 4.31 MIL/uL (ref 3.87–5.11)
RDW: 11.9 % (ref 11.5–15.5)
WBC: 6 10*3/uL (ref 4.0–10.5)
nRBC: 0 % (ref 0.0–0.2)

## 2022-06-21 LAB — BASIC METABOLIC PANEL
Anion gap: 11 (ref 5–15)
BUN: 10 mg/dL (ref 6–20)
CO2: 24 mmol/L (ref 22–32)
Calcium: 8.8 mg/dL — ABNORMAL LOW (ref 8.9–10.3)
Chloride: 102 mmol/L (ref 98–111)
Creatinine, Ser: 0.87 mg/dL (ref 0.44–1.00)
GFR, Estimated: >60 mL/min (ref >60)
Glucose, Bld: 85 mg/dL (ref 70–99)
Potassium: 3.5 mmol/L (ref 3.5–5.1)
Sodium: 137 mmol/L (ref 135–145)

## 2022-06-21 MED ORDER — IOHEXOL 350 MG/ML SOLN
75.0000 mL | Freq: Once | INTRAVENOUS | Status: AC | PRN
  Administered 2022-06-21: 75 mL via INTRAVENOUS
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
## 2022-06-21 NOTE — ED Triage Notes (Signed)
Pt states she is not having a lot of pain today but needs to have documentation that she was evaluated.
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
## 2022-06-21 NOTE — Discharge Instructions (Addendum)
CTs of your head and neck were normal but your x-ray of your hand is as below and you can follow-up with the orthopedic surgeon for this.  Your blood pressure was slightly elevated today follow this up with a primary care doctor for a recheck.  You can take Tylenol 1 g every 8 hours and ibuprofen 600 every 8 hours with food to help with pain.  Return to ER if you develop worsening symptoms or any other concerns.   Small linear smooth marginated calcific density adjacent to the base of proximal phalanx of left index finger may suggest old avulsion. Less likely possibility would be recent avulsion.
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
